# Patient Record
Sex: Female | Born: 1970 | Race: White | Hispanic: Yes | Marital: Married | Smoking: Never smoker
Health system: Southern US, Community
[De-identification: ages and names within clinical notes are randomized; demographics above are authoritative.]

## PROBLEM LIST (undated history)

## (undated) DIAGNOSIS — E119 Type 2 diabetes mellitus without complications: Secondary | ICD-10-CM

## (undated) DIAGNOSIS — I10 Essential (primary) hypertension: Secondary | ICD-10-CM

## (undated) DIAGNOSIS — D649 Anemia, unspecified: Secondary | ICD-10-CM

## (undated) HISTORY — PX: CHOLECYSTECTOMY: SHX55

## (undated) HISTORY — DX: Type 2 diabetes mellitus without complications: E11.9

## (undated) HISTORY — DX: Anemia, unspecified: D64.9

---

## 2006-03-21 ENCOUNTER — Inpatient Hospital Stay (HOSPITAL_COMMUNITY): Admission: AD | Admit: 2006-03-21 | Discharge: 2006-03-21 | Payer: Self-pay | Admitting: Obstetrics & Gynecology

## 2006-03-22 ENCOUNTER — Ambulatory Visit: Payer: Self-pay | Admitting: Family Medicine

## 2006-03-23 ENCOUNTER — Ambulatory Visit: Payer: Self-pay | Admitting: *Deleted

## 2006-06-08 ENCOUNTER — Ambulatory Visit: Payer: Self-pay | Admitting: Internal Medicine

## 2006-06-13 ENCOUNTER — Ambulatory Visit (HOSPITAL_COMMUNITY): Admission: RE | Admit: 2006-06-13 | Discharge: 2006-06-13 | Payer: Self-pay | Admitting: Internal Medicine

## 2006-09-25 ENCOUNTER — Ambulatory Visit: Payer: Self-pay | Admitting: Internal Medicine

## 2007-03-23 ENCOUNTER — Ambulatory Visit: Payer: Self-pay | Admitting: Internal Medicine

## 2007-04-13 ENCOUNTER — Ambulatory Visit: Payer: Self-pay | Admitting: Internal Medicine

## 2007-04-25 ENCOUNTER — Encounter: Payer: Self-pay | Admitting: Family Medicine

## 2007-04-25 ENCOUNTER — Ambulatory Visit: Payer: Self-pay | Admitting: Internal Medicine

## 2007-04-25 LAB — CONVERTED CEMR LAB
CO2: 25 meq/L (ref 19–32)
Calcium: 9.2 mg/dL (ref 8.4–10.5)
Chloride: 102 meq/L (ref 96–112)
Cholesterol: 192 mg/dL (ref 0–200)
Creatinine, Ser: 0.62 mg/dL (ref 0.40–1.20)
Eosinophils Relative: 1 % (ref 0–5)
Glucose, Bld: 79 mg/dL (ref 70–99)
HCT: 35.1 % — ABNORMAL LOW (ref 36.0–46.0)
Hemoglobin: 11.5 g/dL — ABNORMAL LOW (ref 12.0–15.0)
Lymphocytes Relative: 36 % (ref 12–46)
Lymphs Abs: 2.2 10*3/uL (ref 0.7–4.0)
Monocytes Absolute: 0.4 10*3/uL (ref 0.1–1.0)
Monocytes Relative: 7 % (ref 3–12)
RBC: 4.26 M/uL (ref 3.87–5.11)
Total Bilirubin: 1 mg/dL (ref 0.3–1.2)
Total CHOL/HDL Ratio: 4.7
Triglycerides: 159 mg/dL — ABNORMAL HIGH (ref <150)
VLDL: 32 mg/dL (ref 0–40)
WBC: 6 10*3/uL (ref 4.0–10.5)

## 2007-06-15 ENCOUNTER — Encounter: Payer: Self-pay | Admitting: Family Medicine

## 2007-06-15 ENCOUNTER — Ambulatory Visit: Payer: Self-pay | Admitting: Internal Medicine

## 2007-06-15 LAB — CONVERTED CEMR LAB: Chlamydia, DNA Probe: NEGATIVE

## 2007-06-21 ENCOUNTER — Encounter: Admission: RE | Admit: 2007-06-21 | Discharge: 2007-06-21 | Payer: Self-pay | Admitting: Family Medicine

## 2007-09-05 ENCOUNTER — Emergency Department (HOSPITAL_COMMUNITY): Admission: EM | Admit: 2007-09-05 | Discharge: 2007-09-05 | Payer: Self-pay | Admitting: Emergency Medicine

## 2007-09-27 ENCOUNTER — Ambulatory Visit: Payer: Self-pay | Admitting: Internal Medicine

## 2008-07-17 ENCOUNTER — Emergency Department (HOSPITAL_COMMUNITY): Admission: EM | Admit: 2008-07-17 | Discharge: 2008-07-17 | Payer: Self-pay | Admitting: Emergency Medicine

## 2008-08-22 ENCOUNTER — Ambulatory Visit: Payer: Self-pay | Admitting: Internal Medicine

## 2010-04-18 LAB — URINALYSIS, ROUTINE W REFLEX MICROSCOPIC
Bilirubin Urine: NEGATIVE
Ketones, ur: 15 mg/dL — AB
Nitrite: NEGATIVE
Protein, ur: NEGATIVE mg/dL
Urobilinogen, UA: 0.2 mg/dL (ref 0.0–1.0)

## 2010-04-18 LAB — COMPREHENSIVE METABOLIC PANEL
AST: 23 U/L (ref 0–37)
Albumin: 4.1 g/dL (ref 3.5–5.2)
Alkaline Phosphatase: 54 U/L (ref 39–117)
CO2: 24 mEq/L (ref 19–32)
Chloride: 99 mEq/L (ref 96–112)
Creatinine, Ser: 0.59 mg/dL (ref 0.4–1.2)
GFR calc Af Amer: >60 mL/min (ref >60)
GFR calc non Af Amer: >60 mL/min (ref >60)
Potassium: 3 mEq/L — ABNORMAL LOW (ref 3.5–5.1)
Total Bilirubin: 1.1 mg/dL (ref 0.3–1.2)

## 2010-04-18 LAB — URINE CULTURE: Colony Count: NO GROWTH

## 2010-04-18 LAB — DIFFERENTIAL
Basophils Absolute: 0 10*3/uL (ref 0.0–0.1)
Basophils Relative: 0 % (ref 0–1)
Eosinophils Absolute: 0 10*3/uL (ref 0.0–0.7)
Eosinophils Relative: 0 % (ref 0–5)
Lymphocytes Relative: 5 % — ABNORMAL LOW (ref 12–46)
Monocytes Absolute: 0.6 10*3/uL (ref 0.1–1.0)

## 2010-04-18 LAB — CBC
HCT: 33.6 % — ABNORMAL LOW (ref 36.0–46.0)
MCV: 79.5 fL (ref 78.0–100.0)
Platelets: 272 10*3/uL (ref 150–400)
RBC: 4.23 MIL/uL (ref 3.87–5.11)
WBC: 12.7 10*3/uL — ABNORMAL HIGH (ref 4.0–10.5)

## 2010-04-18 LAB — RAPID STREP SCREEN (MED CTR MEBANE ONLY): Streptococcus, Group A Screen (Direct): POSITIVE — AB

## 2010-04-18 LAB — PREGNANCY, URINE: Preg Test, Ur: NEGATIVE

## 2010-04-18 LAB — URINE MICROSCOPIC-ADD ON

## 2010-04-25 IMAGING — CR DG CHEST 2V
2 series · 2 of 2 positions shown · non-contrast
Comparison: None

CLINICAL DATA: Hypertension.  Headache.  Fever.

CHEST - 2 VIEW

[w chest pa]
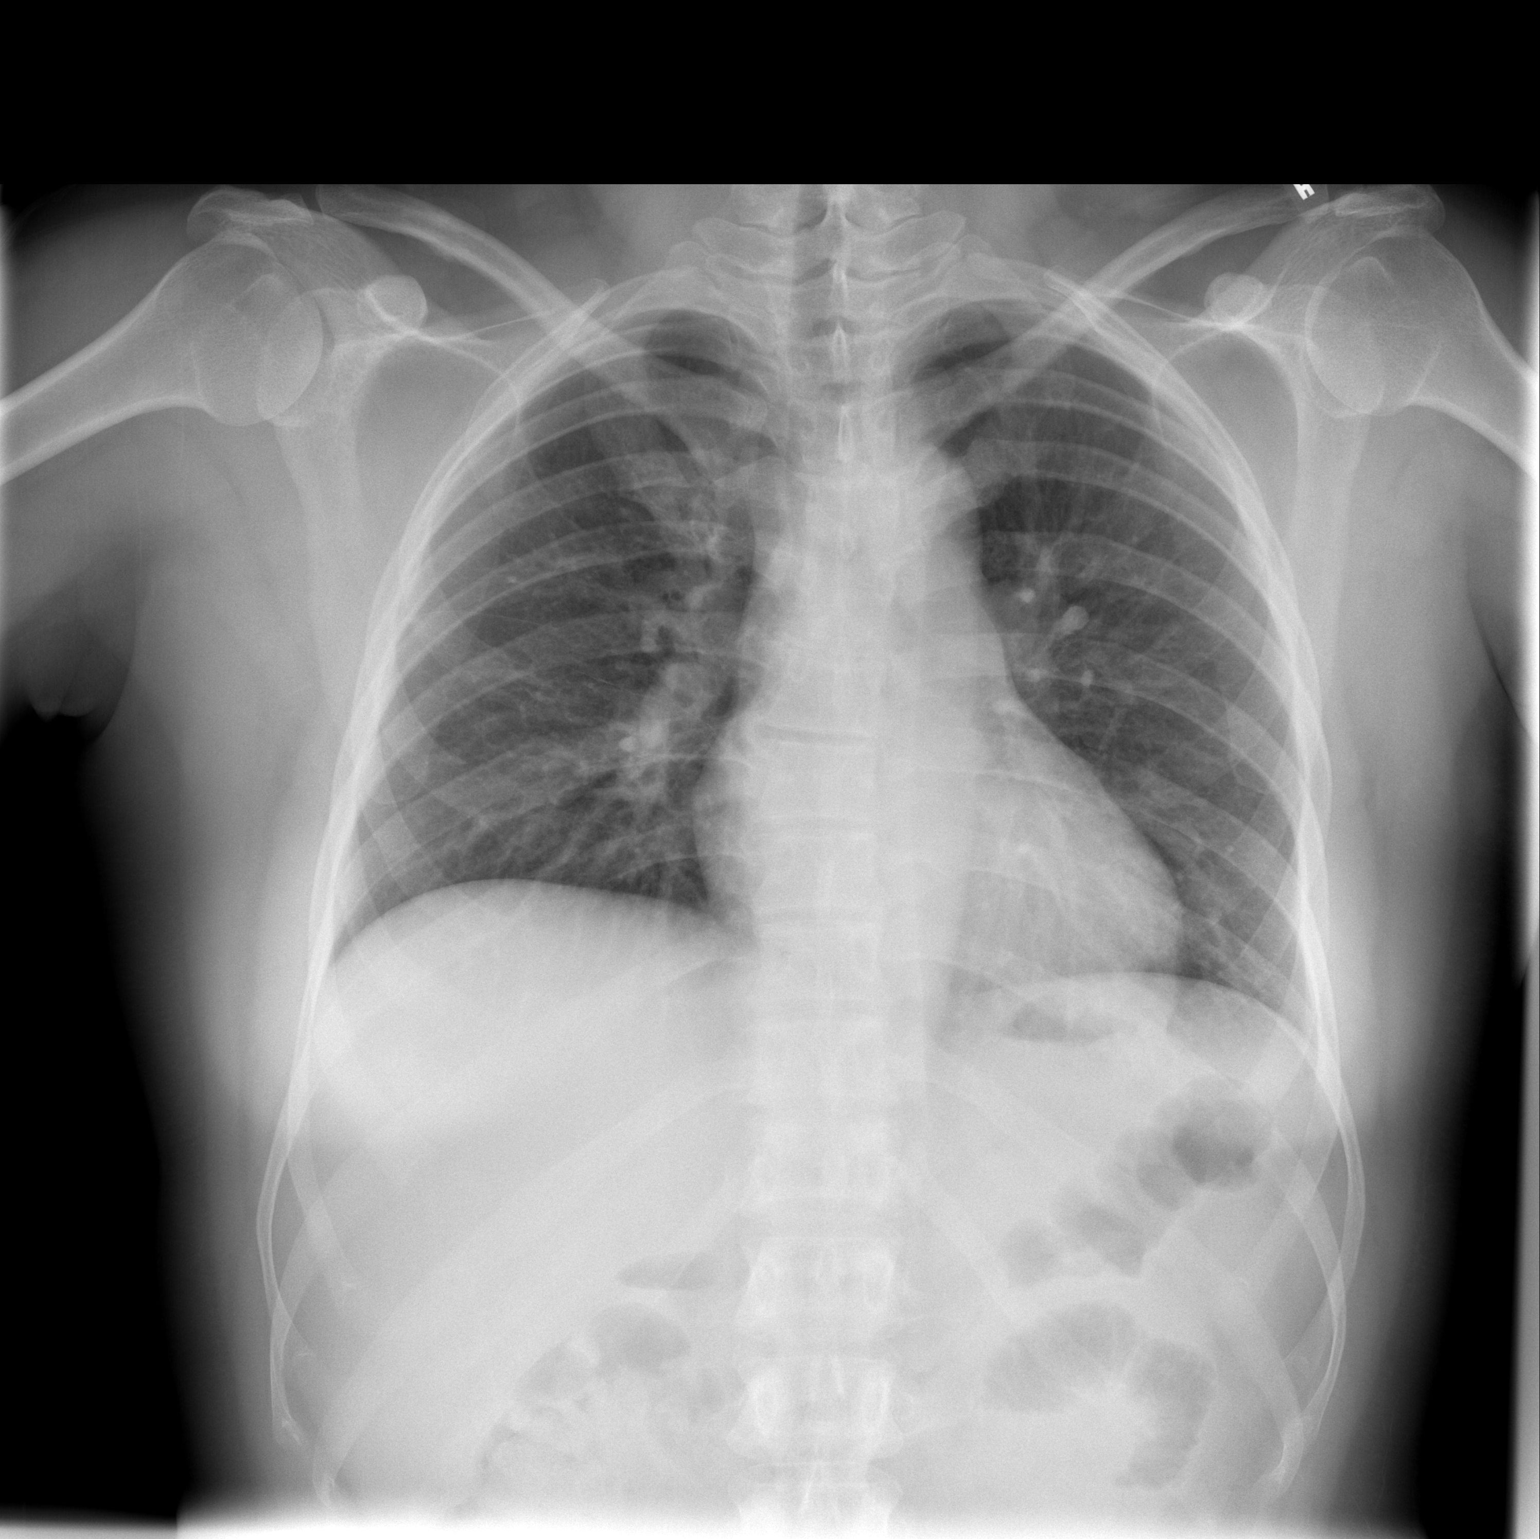

[w chest lat]
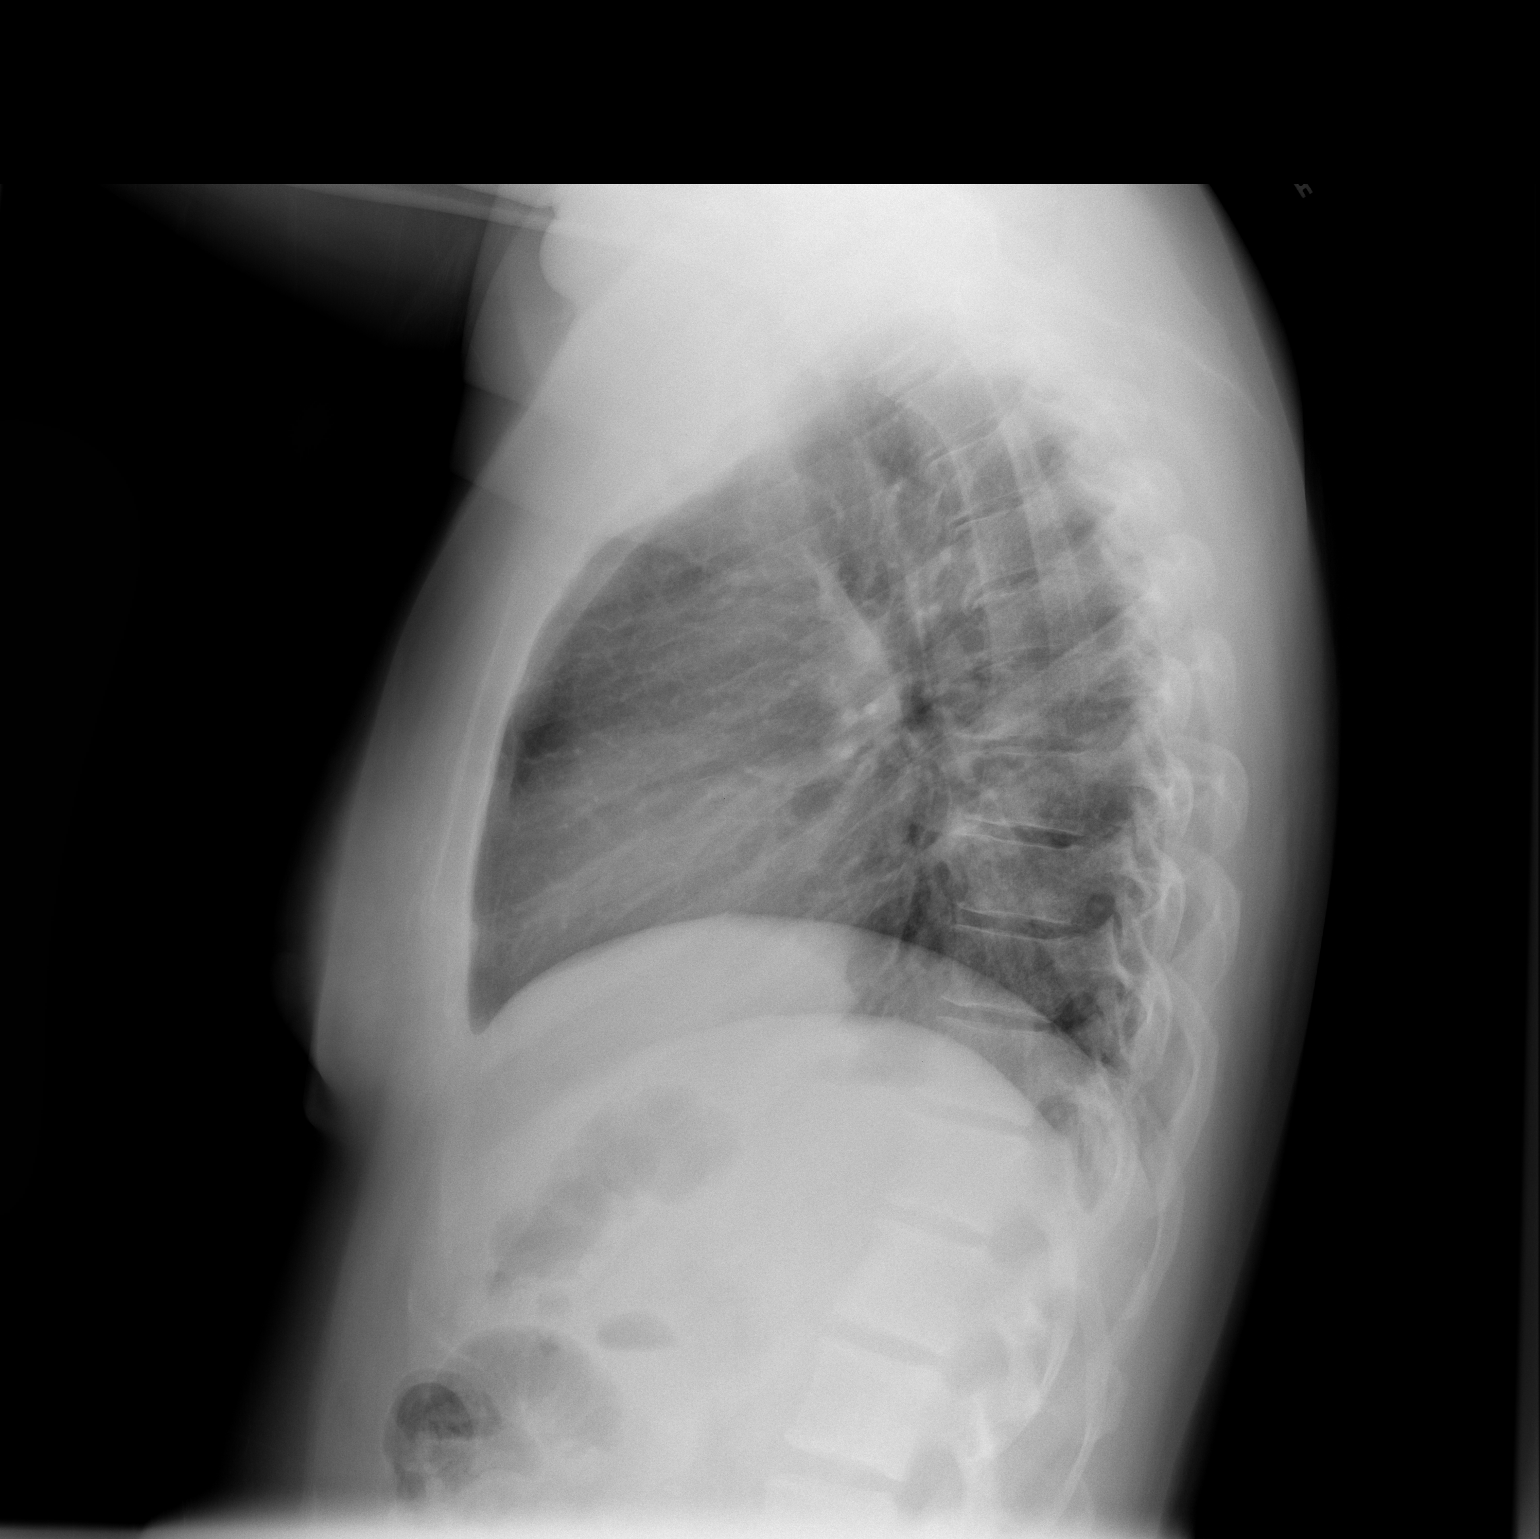

[2 of 2 positions shown; findings below may reference images not displayed]

FINDINGS: The heart size and mediastinal contours are within
normal limits.  Both lungs are clear.  Mild chronic peribronchial
thickening.  The visualized skeletal structures are unremarkable.
IMPRESSION: No active cardiopulmonary disease. Mild chronic bronchitic changes.

## 2012-04-18 ENCOUNTER — Other Ambulatory Visit (HOSPITAL_COMMUNITY): Payer: Self-pay | Admitting: General Surgery

## 2012-04-18 ENCOUNTER — Other Ambulatory Visit: Payer: Self-pay | Admitting: General Surgery

## 2012-04-18 ENCOUNTER — Other Ambulatory Visit (HOSPITAL_COMMUNITY)
Admission: RE | Admit: 2012-04-18 | Discharge: 2012-04-18 | Disposition: A | Discharge status: Home / Self Care | Payer: Self-pay | Source: Ambulatory Visit | Attending: General Surgery | Admitting: General Surgery

## 2012-04-18 DIAGNOSIS — N83209 Unspecified ovarian cyst, unspecified side: Secondary | ICD-10-CM | POA: Insufficient documentation

## 2012-04-18 DIAGNOSIS — R1032 Left lower quadrant pain: Secondary | ICD-10-CM | POA: Insufficient documentation

## 2012-04-18 DIAGNOSIS — Z139 Encounter for screening, unspecified: Secondary | ICD-10-CM

## 2012-04-24 ENCOUNTER — Ambulatory Visit (HOSPITAL_COMMUNITY)
Admission: RE | Admit: 2012-04-24 | Discharge: 2012-04-24 | Disposition: A | Discharge status: Home / Self Care | Payer: Self-pay | Source: Ambulatory Visit | Attending: General Surgery | Admitting: General Surgery

## 2012-04-24 ENCOUNTER — Other Ambulatory Visit (HOSPITAL_COMMUNITY): Payer: Self-pay | Admitting: General Surgery

## 2012-04-24 DIAGNOSIS — Z139 Encounter for screening, unspecified: Secondary | ICD-10-CM

## 2012-04-24 DIAGNOSIS — R109 Unspecified abdominal pain: Secondary | ICD-10-CM | POA: Insufficient documentation

## 2012-04-24 DIAGNOSIS — N8300 Follicular cyst of ovary, unspecified side: Secondary | ICD-10-CM | POA: Insufficient documentation

## 2012-04-24 DIAGNOSIS — Z1231 Encounter for screening mammogram for malignant neoplasm of breast: Secondary | ICD-10-CM | POA: Insufficient documentation

## 2012-04-24 DIAGNOSIS — N83209 Unspecified ovarian cyst, unspecified side: Secondary | ICD-10-CM | POA: Insufficient documentation

## 2012-05-02 ENCOUNTER — Ambulatory Visit (INDEPENDENT_AMBULATORY_CARE_PROVIDER_SITE_OTHER): Payer: Self-pay | Admitting: Obstetrics and Gynecology

## 2012-05-02 ENCOUNTER — Encounter: Payer: Self-pay | Admitting: Obstetrics and Gynecology

## 2012-05-02 VITALS — BP 160/100 | Wt 179.4 lb

## 2012-05-02 DIAGNOSIS — N920 Excessive and frequent menstruation with regular cycle: Secondary | ICD-10-CM

## 2012-05-02 DIAGNOSIS — N921 Excessive and frequent menstruation with irregular cycle: Secondary | ICD-10-CM

## 2012-05-02 MED ORDER — NORETHINDRONE 0.35 MG PO TABS: 1.0000 | ORAL_TABLET | Freq: Every day | ORAL | Status: DC

## 2012-05-02 MED ORDER — TRAMADOL HCL 50 MG PO TABS: 50.0000 mg | ORAL_TABLET | ORAL | Status: DC

## 2012-05-02 NOTE — Patient Instructions (Signed)
Begin oral contraceptives minipill to take 1 tablet daily beginning today. You may try tramadol for pain management or use ibuprofen during menses

## 2012-05-02 NOTE — Progress Notes (Signed)
  Assessment:  Menorrhagia with anemia #2: Stable small left ovarian cyst felt clinically significant   Plan:  1 begin progesterone only contraceptive pills for menstrual regulation Micronor 1 by mouth daily 2: Followup visit in 6 weeks for review of systems  Subjective:  Angela Ritter is a 42 y.o. female, No obstetric history on file., who presents for referral from Dr. Hebert Soho who saw her in ultrasound had shown a left ovarian cyst 4.0 cm, simple cyst, with the uterus that's upper limits normal size without discrete fibroids. She is anemic and periods are  lasting 7 days , with clots for 3-4 of the 7 days  The following portions of the patient's history were reviewed and updated as appropriate: allergies, current medications, past medical & surgical history, & past family history.   There is no significant family history of breast or ovarian cancer.    Review of Systems Pertinent items are noted in HPI. Breast:Negative for breast lump,nipple discharge or nipple retraction Gastrointestinal: Negative for abdominal pain, change in bowel habits or rectal bleeding GU: Negative for dysuria, frequency, urgency or incontinence.   GYN: Patient's last menstrual period was 05/01/2012. the pain is described as worse with her periods is across the lower abdomen over to the left side and down the left thigh  Objective:  BP 160/100  Wt 179 lb 6.4 oz (81.375 kg)  LMP 05/01/2012    BMI: There is no height on file to calculate BMI.  General Appearance: Alert, appropriate appearance for age. No acute distress HEENT: Grossly normal Neck / Thyroid: Supple, no masses, nodes or enlargement Cardiovascular: Regular rate and rhythm. S1, S2, no murmur Lungs: Clear to auscultation bilaterally Back: No CVA tenderness Gastrointestinal: Soft, non-tender, no masses or organomegaly Pelvic Exam: External genitalia: normal general appearance Vaginal: normal mucosa without prolapse or lesions Cervix:  Multiparous Adnexa: normal bimanual exam Uterus: mid-position and Slight tenderness not felt to be infectious but appropriate for patient being examined her menses Rectovaginal: not indicated  Christin Bach MD

## 2012-05-04 ENCOUNTER — Other Ambulatory Visit: Payer: Self-pay | Admitting: General Surgery

## 2012-05-04 DIAGNOSIS — R928 Other abnormal and inconclusive findings on diagnostic imaging of breast: Secondary | ICD-10-CM

## 2012-05-23 ENCOUNTER — Other Ambulatory Visit: Payer: Self-pay | Admitting: General Surgery

## 2012-05-23 ENCOUNTER — Ambulatory Visit (HOSPITAL_COMMUNITY)
Admission: RE | Admit: 2012-05-23 | Discharge: 2012-05-23 | Disposition: A | Discharge status: Home / Self Care | Payer: Self-pay | Source: Ambulatory Visit | Attending: General Surgery | Admitting: General Surgery

## 2012-05-23 ENCOUNTER — Other Ambulatory Visit (HOSPITAL_COMMUNITY): Payer: Self-pay | Admitting: General Surgery

## 2012-05-23 DIAGNOSIS — R928 Other abnormal and inconclusive findings on diagnostic imaging of breast: Secondary | ICD-10-CM

## 2012-05-23 DIAGNOSIS — N63 Unspecified lump in unspecified breast: Secondary | ICD-10-CM | POA: Insufficient documentation

## 2012-05-28 ENCOUNTER — Other Ambulatory Visit: Payer: Self-pay

## 2012-06-06 ENCOUNTER — Ambulatory Visit (HOSPITAL_COMMUNITY)
Admission: RE | Admit: 2012-06-06 | Discharge: 2012-06-06 | Disposition: A | Discharge status: Home / Self Care | Payer: Self-pay | Source: Ambulatory Visit | Attending: General Surgery | Admitting: General Surgery

## 2012-06-06 ENCOUNTER — Encounter (HOSPITAL_COMMUNITY): Payer: Self-pay

## 2012-06-06 ENCOUNTER — Other Ambulatory Visit (HOSPITAL_COMMUNITY): Payer: Self-pay | Admitting: General Surgery

## 2012-06-06 DIAGNOSIS — N63 Unspecified lump in unspecified breast: Secondary | ICD-10-CM | POA: Insufficient documentation

## 2012-06-06 DIAGNOSIS — D249 Benign neoplasm of unspecified breast: Secondary | ICD-10-CM | POA: Insufficient documentation

## 2012-06-06 MED ORDER — LIDOCAINE HCL (PF) 2 % IJ SOLN
INTRAMUSCULAR | Status: AC
  Administered 2012-06-06: 10 mL via INTRADERMAL
  Filled 2012-06-06: qty 10

## 2012-06-06 NOTE — Progress Notes (Signed)
Lidocaine 2%          10mL injected                         Right breast biopsy performed

## 2012-06-27 ENCOUNTER — Ambulatory Visit: Payer: Self-pay | Admitting: Obstetrics and Gynecology

## 2014-03-01 IMAGING — US US BREAST*R*
1 series · 4 of 4 positions shown · non-contrast
Comparison: With priors

CLINICAL DATA: Abnormal right screening mammogram

DIGITAL DIAGNOSTIC RIGHT MAMMOGRAM WITH CAD AND RIGHT BREAST
ULTRASOUND:

[Series 1: us breast*right* · 0.08mm/px · 4 of 4 slices shown]
[im 1/4]
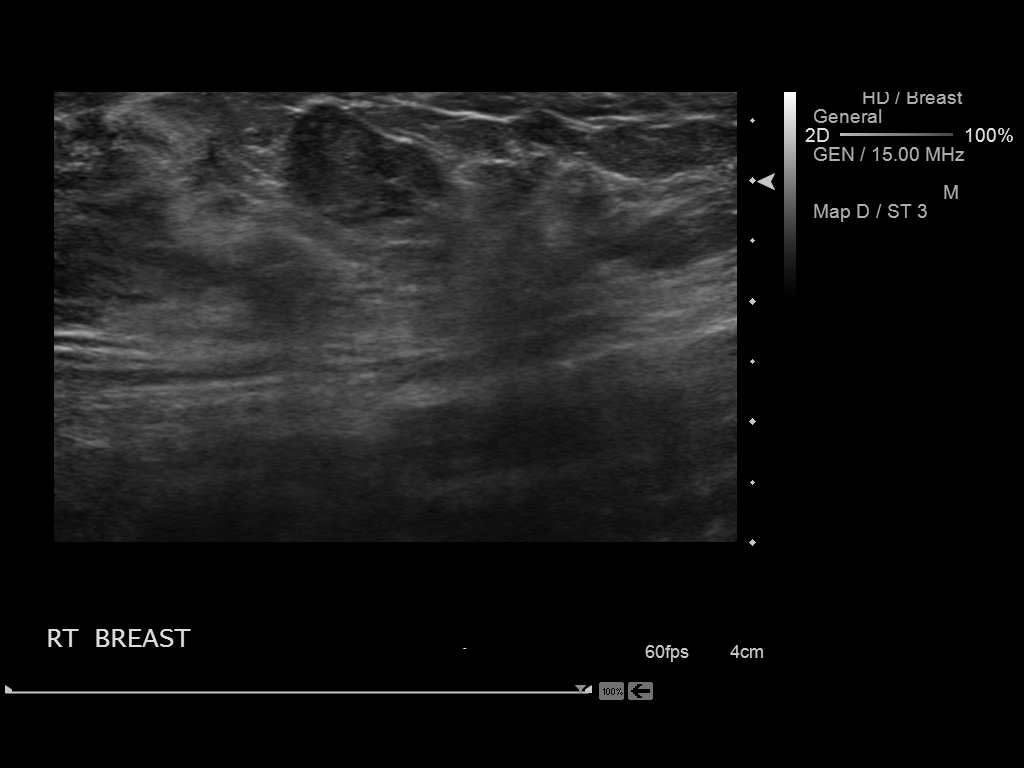
[im 2/4]
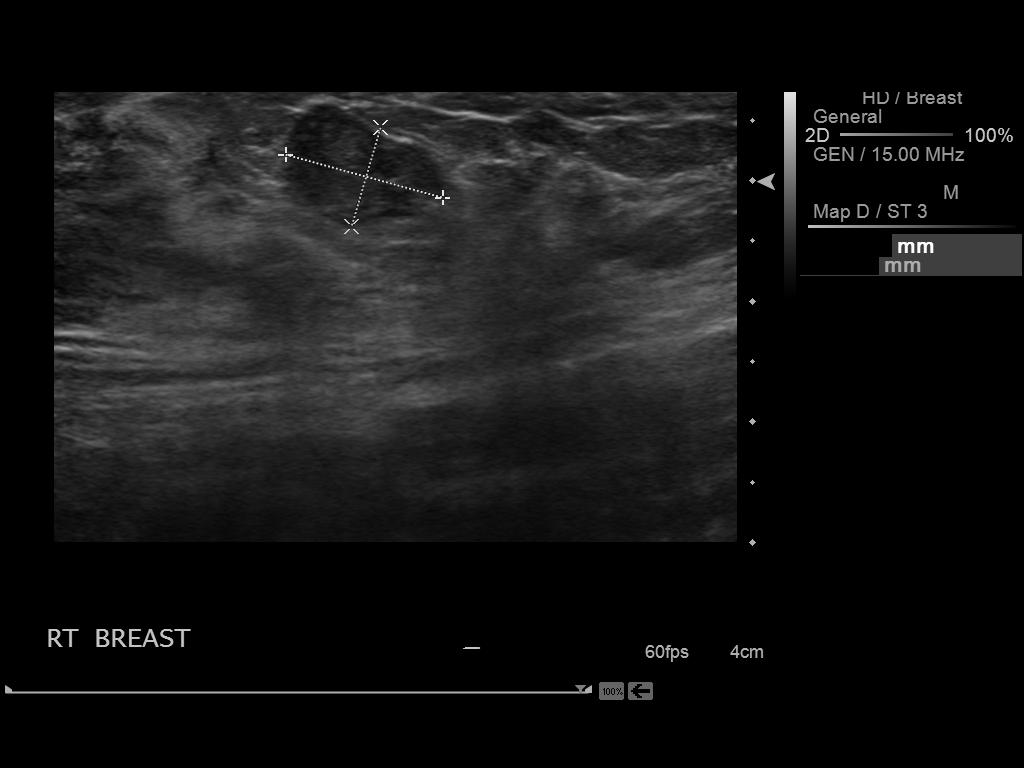
[im 3/4]
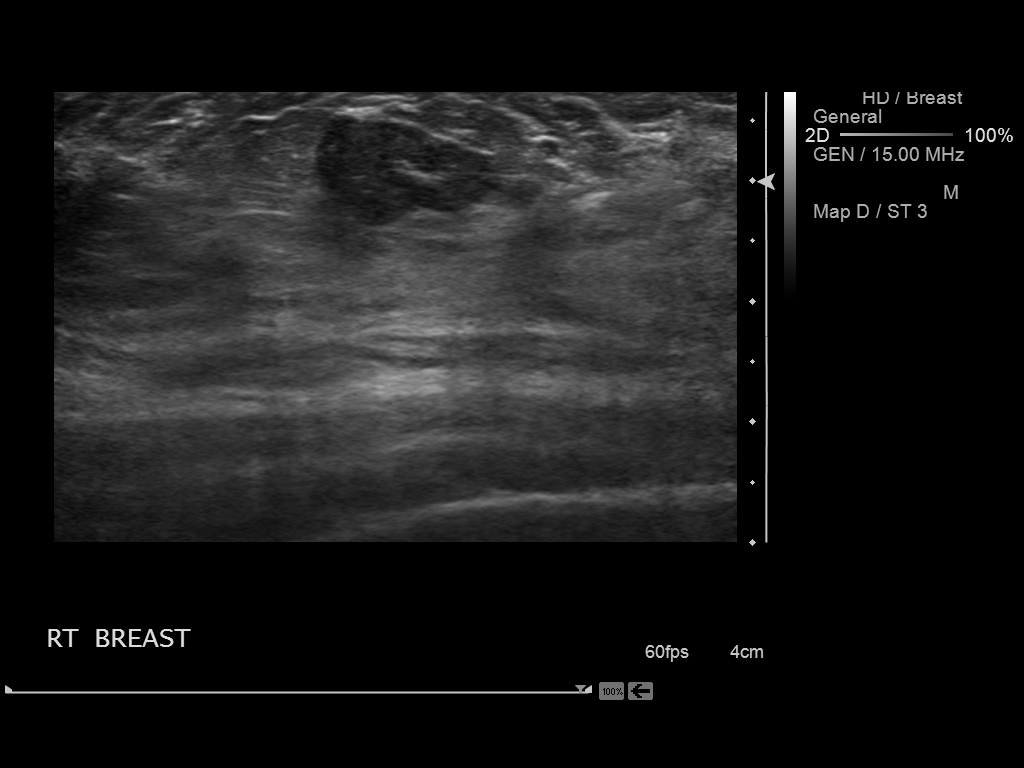
[im 4/4]
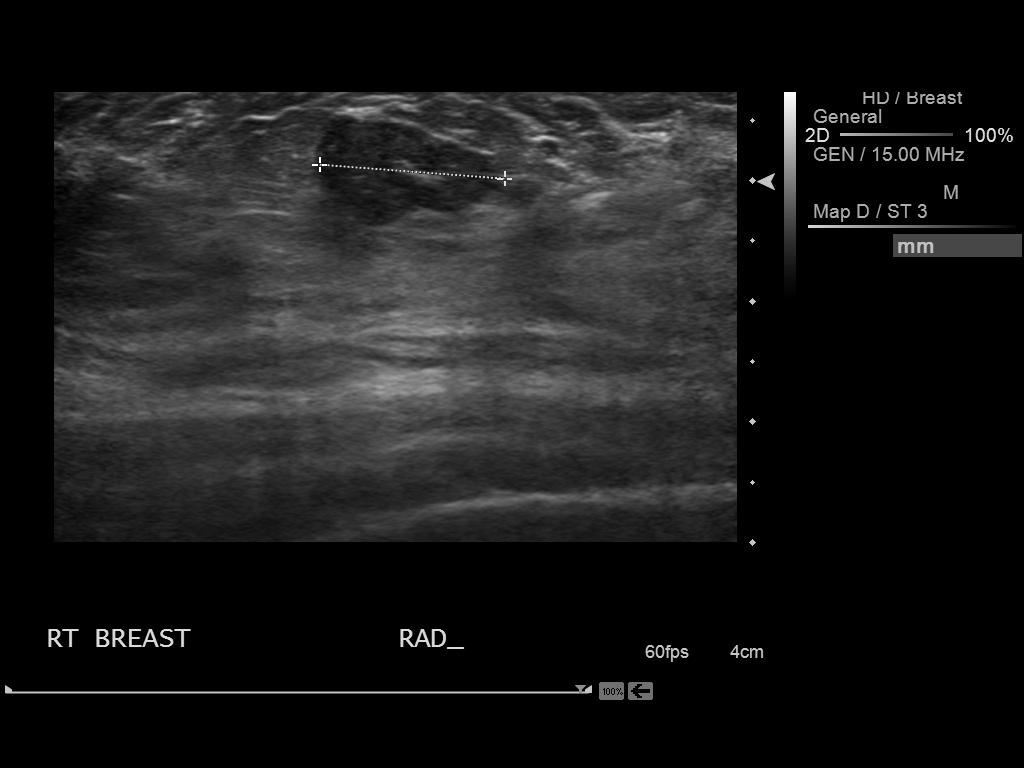

[4 of 4 positions shown; findings below may reference images not displayed]

FINDINGS: ACR Breast Density Category 3: The breast tissue is heterogeneously
dense.

Spot compression and true lateral views of the right breast were
performed.  There is an asymmetric density in the posterior third
of the upper aspect of the breast.  There are no malignant-type
microcalcifications.

Mammographic images were processed with CAD.

On physical exam, I do not palpate a discrete mass in the right
breast.

Ultrasound is performed, showing there is a hypoechoic mass with
increased through transmission in the right breast at 10 o'clock 7
cm from the nipple measuring 1.4 x 0.9 x 1.5 cm.  Sonographic
evaluation of the right axilla fails to reveal enlarged adenopathy.
IMPRESSION: Suspicious right breast mass.

RECOMMENDATION:
Ultrasound guided core biopsy of the right breast is recommended.
The patient is being referred to [facility].

I have discussed the findings and recommendations with the patient.
Results were also provided in writing at the conclusion of the
visit.  If applicable, a reminder letter will be sent to the
patient regarding the next appointment.

BI-RADS CATEGORY 4:  Suspicious abnormality - biopsy should be
considered.

## 2014-03-15 IMAGING — US US RT BREAST BX W LOC DEV 1ST LESION IMG BX SPEC US GUIDE
1 series · 8 of 8 positions shown · non-contrast
Comparison: none

***ADDENDUM*** CREATED: 06/07/2012 [DATE]

Final pathology demonstrates a FIBROADENOMA.
Histology correlates with imaging findings.
The patient'Wasi Jumper, Blain, was contacted by phone on 06/07/2012
and these results given to her which she understood. Her questions
were answered.
Per her daughter, the patient had no complaints with her biopsy
site, .
Recommend annual bilateral screening mammograms.
***END ADDENDUM*** SIGNED BY: Utimlal Rangrej, M.D.
CLINICAL DATA: 1.5 cm oval nodule at 10 o'clock, 7 cm from the
right nipple

[Series 1: us right breast bx w loc dev 1st lesion img bx spe · 0.08mm/px · 8 of 8 slices shown]
[im 1/8]
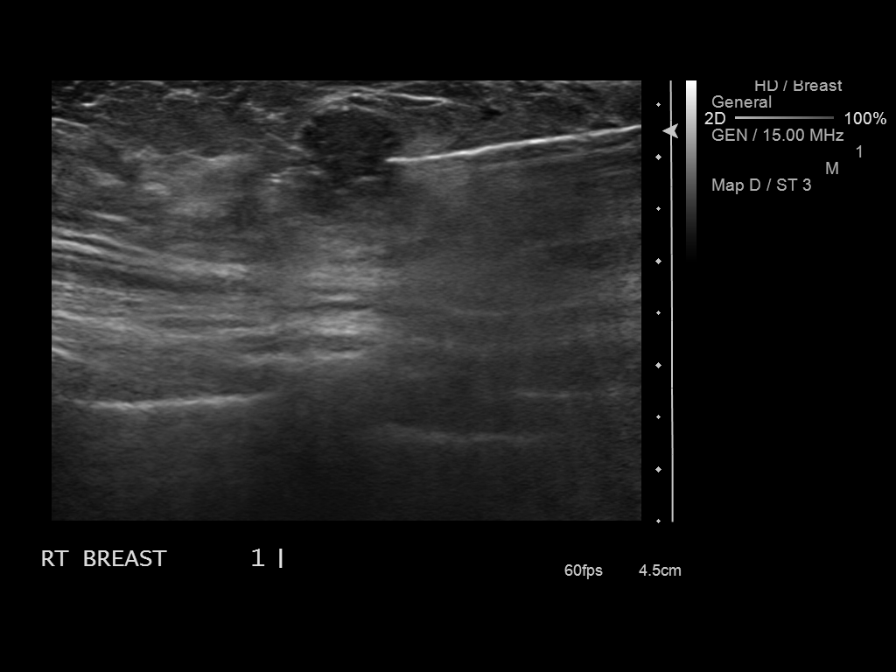
[im 2/8]
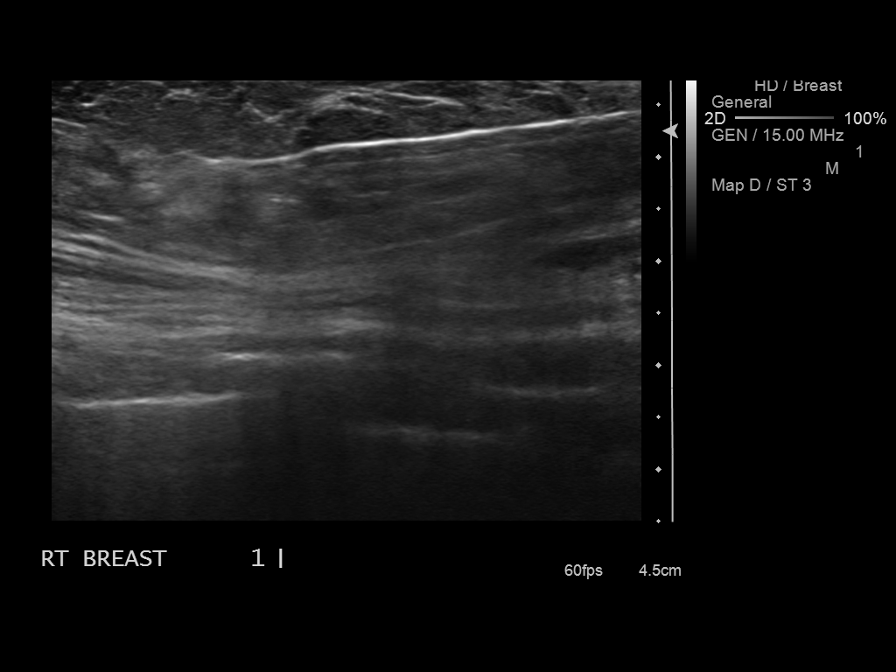
[im 3/8]
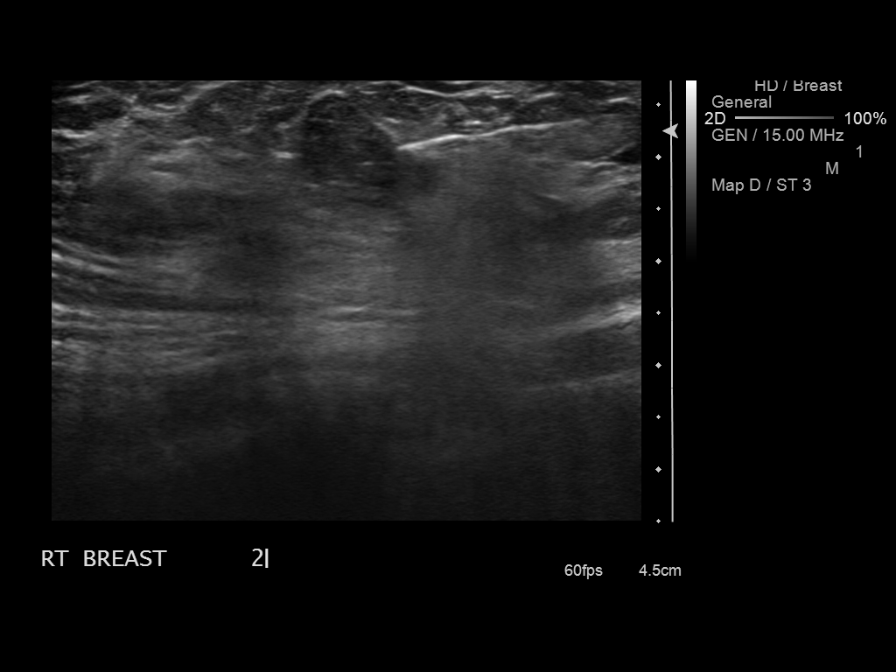
[im 4/8]
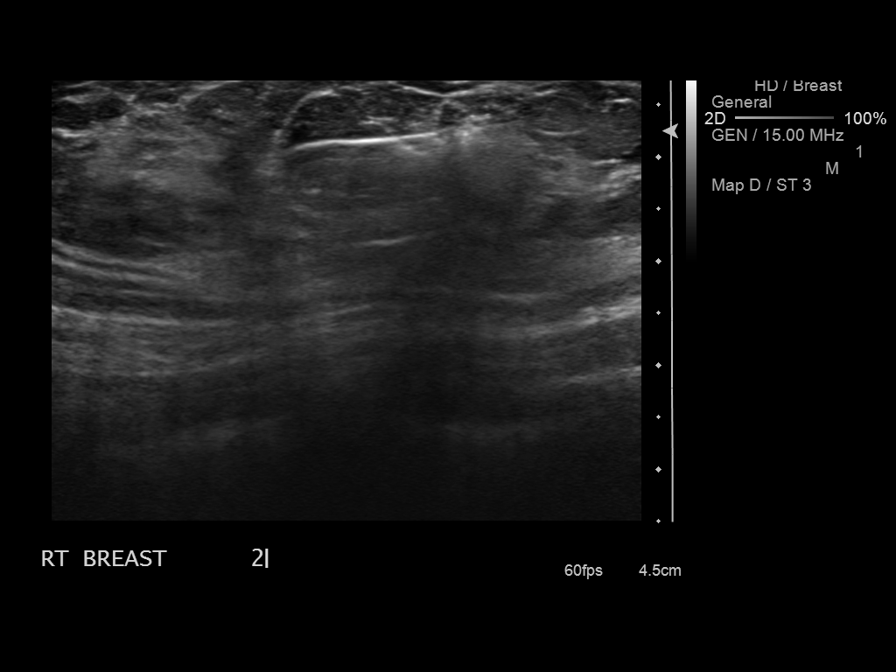
[im 5/8]
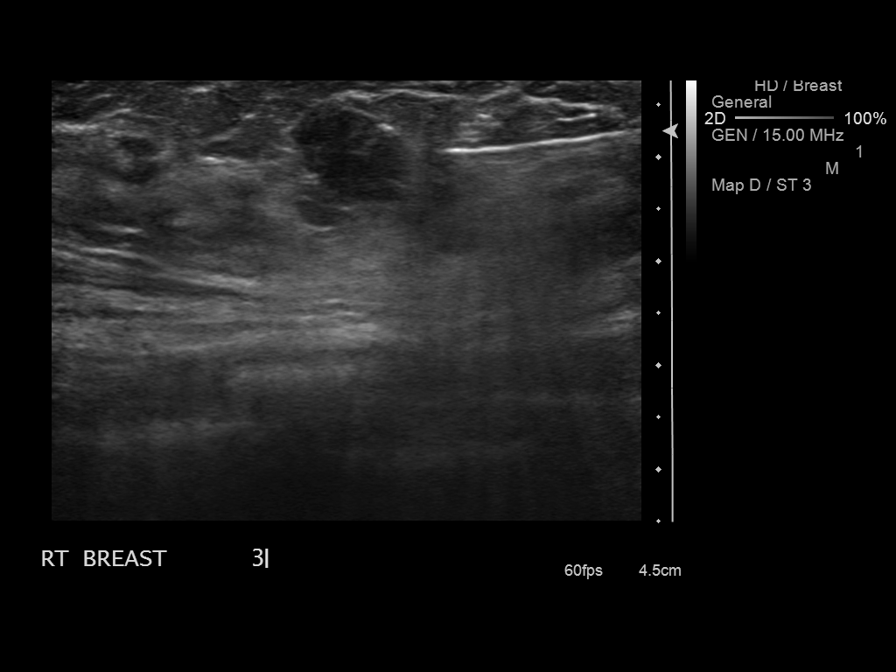
[im 6/8]
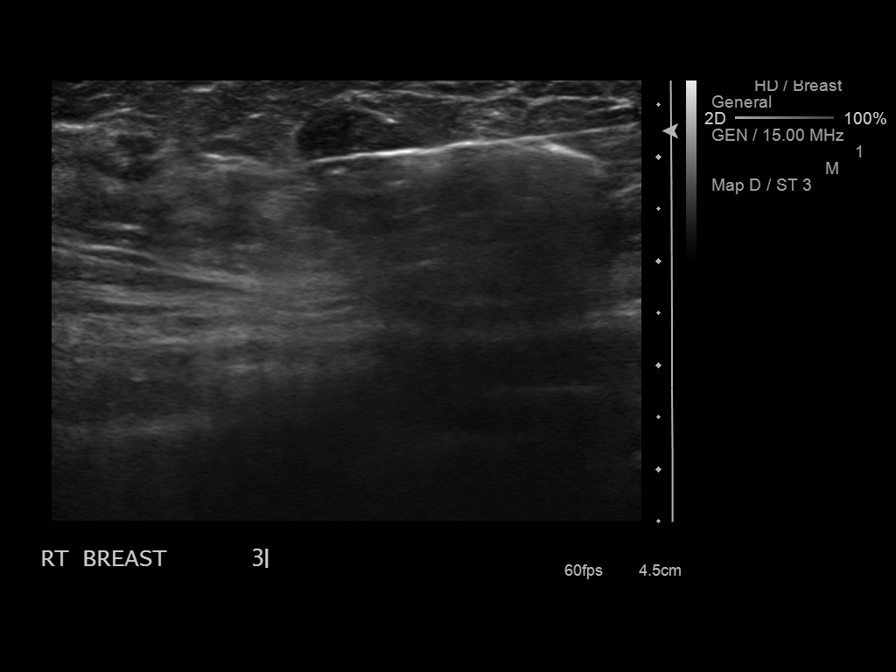
[im 7/8]
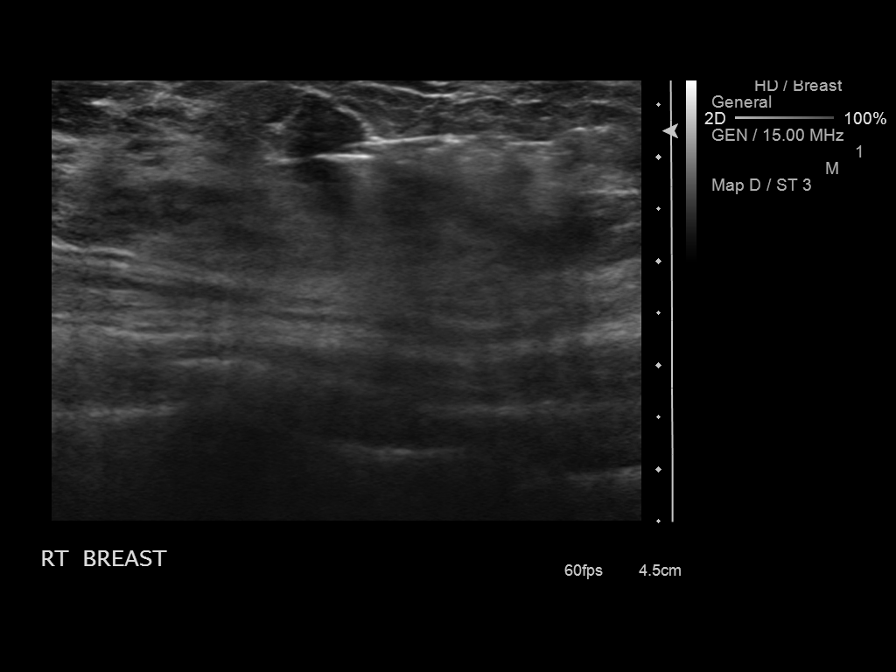
[im 8/8]
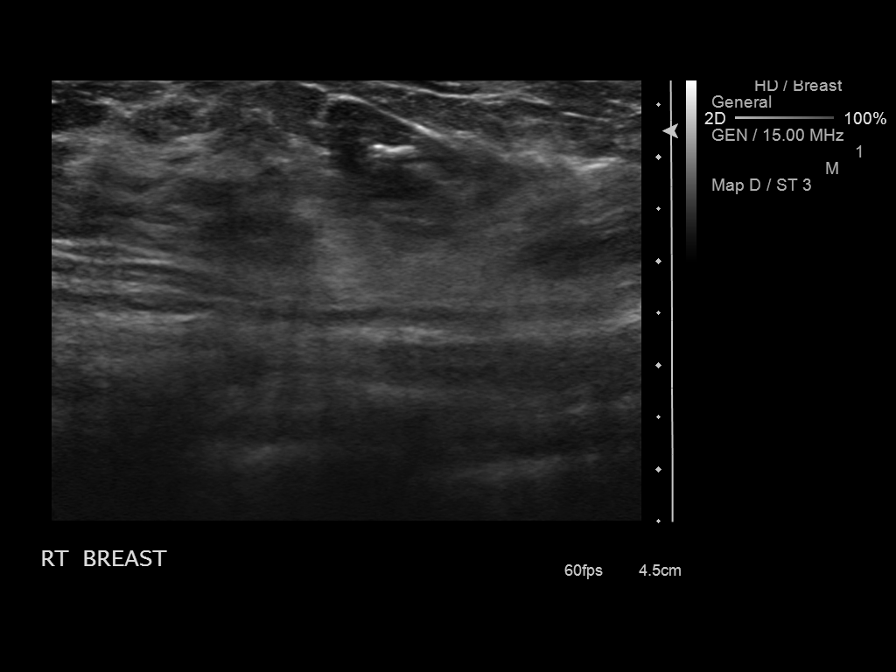

[8 of 8 positions shown; findings below may reference images not displayed]

ULTRASOUND GUIDED CORE BIOPSY OF THE RIGHT BREAST

The patient and I discussed the procedure of ultrasound-guided
biopsy, including benefits and alternatives.  We discussed the high
likelihood of a successful procedure. We discussed the risks of the
procedure, including infection, bleeding, tissue injury, clip
migration, and inadequate sampling.  Written informed consent was
given.

Using sterile technique, 2% lidocaine, ultrasound guidance, and a
14 gauge automated biopsy device, biopsy was performed of the
nodule at 10 o'clock, 7 cm from the right nipple, using a
caudocranial approach.  At the conclusion of the procedure, a
ribbon tissue marker clip was deployed into the biopsy cavity.
Follow up 2 view mammogram was performed and dictated separately.
IMPRESSION: Ultrasound guided biopsy of a nodule at 10 o'clock 7 cm from the
right nipple.  No apparent complications.

## 2014-10-03 ENCOUNTER — Ambulatory Visit: Payer: Self-pay

## 2014-10-14 ENCOUNTER — Other Ambulatory Visit: Payer: Self-pay | Admitting: Obstetrics and Gynecology

## 2014-10-14 DIAGNOSIS — Z1231 Encounter for screening mammogram for malignant neoplasm of breast: Secondary | ICD-10-CM

## 2014-10-30 ENCOUNTER — Ambulatory Visit (HOSPITAL_COMMUNITY)
Admission: RE | Admit: 2014-10-30 | Discharge: 2014-10-30 | Disposition: A | Discharge status: Home / Self Care | Payer: Self-pay | Source: Ambulatory Visit | Attending: Obstetrics and Gynecology | Admitting: Obstetrics and Gynecology

## 2014-10-30 ENCOUNTER — Encounter (HOSPITAL_COMMUNITY): Payer: Self-pay

## 2014-10-30 ENCOUNTER — Ambulatory Visit
Admission: RE | Admit: 2014-10-30 | Discharge: 2014-10-30 | Disposition: A | Discharge status: Home / Self Care | Payer: No Typology Code available for payment source | Source: Ambulatory Visit | Attending: Obstetrics and Gynecology | Admitting: Obstetrics and Gynecology

## 2014-10-30 VITALS — BP 116/64 | Temp 98.0°F | Ht 64.5 in | Wt 162.0 lb

## 2014-10-30 DIAGNOSIS — Z1231 Encounter for screening mammogram for malignant neoplasm of breast: Secondary | ICD-10-CM

## 2014-10-30 DIAGNOSIS — Z1239 Encounter for other screening for malignant neoplasm of breast: Secondary | ICD-10-CM

## 2014-10-30 HISTORY — DX: Essential (primary) hypertension: I10

## 2014-10-30 NOTE — Patient Instructions (Addendum)
Education materials on self breast awareness given. Explained to [facility] that she did not need a Pap smear today due to last Pap smear was 04/18/2012. Let her know [facility] will cover Pap smears every 3 years unless has a history of abnormal Pap smears. Reminded patient that her next Pap smear is due next April and to call Sabrina to schedule appointment with [facility] . Referred patient to the [facility] for screening mammogram. Appointment scheduled for Thursday, October 30, 2014 at 1110. Patient aware of appointment and will be there. Let patient know the [facility] will follow up with her within the next couple weeks with results by letter or phone. Angela Ritter verbalized understanding.  Phoenyx Melka, Arvil Chaco, RN 11:55 AM

## 2014-10-30 NOTE — Addendum Note (Signed)
Encounter addended by: Loletta Parish, RN on: 10/30/2014  1:16 PM<BR>     Documentation filed: Patient Instructions Section

## 2014-10-30 NOTE — Progress Notes (Addendum)
No complaints today.  Pap Smear:  Pap smear not completed today. Last Pap smear was 04/18/2012 at Dr. Scherry Ran office and normal. Per patient has no history of an abnormal Pap smear. Last Pap smear result is in EPIC.  Physical exam: Breasts Breasts symmetrical. No skin abnormalities bilateral breasts. No nipple retraction bilateral breasts. No nipple discharge bilateral breasts. No lymphadenopathy. No lumps palpated bilateral breasts. No complaints of pain or tenderness on exam. Referred patient to the [facility] for screening mammogram. Appointment scheduled for Thursday, October 30, 2014 at 1110.     Pelvic/Bimanual No Pap smear completed today since last Pap smear was 04/18/2012. Pap smear not indicated per [facility] guidelines.   Used interpreter Benjamine Sprague.

## 2014-10-30 NOTE — Addendum Note (Signed)
Encounter addended by: Loletta Parish, RN on: 10/30/2014 12:21 PM<BR>     Documentation filed: Notes Section

## 2015-04-23 ENCOUNTER — Encounter (HOSPITAL_COMMUNITY): Payer: Self-pay

## 2015-04-23 ENCOUNTER — Ambulatory Visit (HOSPITAL_COMMUNITY)
Admission: RE | Admit: 2015-04-23 | Discharge: 2015-04-23 | Disposition: A | Discharge status: Home / Self Care | Payer: Self-pay | Source: Ambulatory Visit | Attending: Obstetrics and Gynecology | Admitting: Obstetrics and Gynecology

## 2015-04-23 VITALS — BP 114/68 | Temp 98.1°F | Ht 64.5 in | Wt 172.6 lb

## 2015-04-23 DIAGNOSIS — Z01419 Encounter for gynecological examination (general) (routine) without abnormal findings: Secondary | ICD-10-CM

## 2015-04-23 NOTE — Patient Instructions (Signed)
Explained to [facility] that [facility] will cover Pap smears and HPV typing every 5 years unless has a history of abnormal Pap smears. Reminded patient that she is due for her screening mammogram in October 2017 and that she will need to renew her [facility] card prior to her mammogram if still eligible for [facility]. Let patient know will follow up with her within the next couple weeks with results of Pap smear by phone. Angela Ritter verbalized understanding.  Lynsee Wands, Arvil Chaco, RN 12:18 PM

## 2015-04-23 NOTE — Progress Notes (Addendum)
No complaints today.   Pap Smear: Pap smear not completed today. Last Pap smear was 04/18/2012 at Dr. Scherry Ran office and normal. Per patient has no history of an abnormal Pap smear. Last Pap smear result is in EPIC.  Pelvic/Bimanual   Ext Genitalia No lesions, no swelling and no discharge observed on external genitalia.         Vagina Vagina pink and normal texture. No lesions or discharge observed in vagina.          Cervix Cervix is present. Cervix pink and red around os. Cervix is of normal texture. No discharge observed.     Uterus Uterus is present and palpable. Uterus in normal position and normal size.        Adnexae Bilateral ovaries present and palpable. No tenderness on palpation.          Rectovaginal No rectal exam completed today since patient had no rectal complaints. No skin abnormalities observed on exam.    Smoking History: Patient has never smoked.  Patient Navigation: Patient education provided. Access to services provided for patient through [facility] program. Spanish interpreter provided.   Used Spanish interpreter Sherolyn Buba from [facility]

## 2015-04-28 LAB — CYTOLOGY - PAP

## 2015-05-11 ENCOUNTER — Telehealth (HOSPITAL_COMMUNITY): Payer: Self-pay | Admitting: *Deleted

## 2015-05-11 NOTE — Telephone Encounter (Signed)
Telephoned patient at home # and discussed normal pap smear results. HPV was negative. Next pap smear due in 5 years. Patient voiced understanding.

## 2015-06-05 ENCOUNTER — Other Ambulatory Visit: Payer: No Typology Code available for payment source

## 2015-06-05 ENCOUNTER — Ambulatory Visit: Payer: Self-pay

## 2015-06-05 VITALS — BP 150/96 | HR 62 | Temp 98.3°F | Resp 18 | Ht 64.0 in | Wt 176.7 lb

## 2015-06-05 DIAGNOSIS — Z0000 Encounter for general adult medical examination without abnormal findings: Secondary | ICD-10-CM

## 2015-06-05 NOTE — Progress Notes (Signed)
Patient is a new patient to the [facility] program and presents with interpreter Anastasio Auerbach and is currently a [facility] patient effective 04/23/2015.   Clinical Measurements: Patient is 5 ft. 4 inches, weight 176.7 lbs, and BMI 30.4   Medical History: Patient has history of high cholesterol which was checked in February and back down to normal per patient. Patient does have a history of  Pre diabetes. Patient has a history of hypertension and has been without medication for at least a month. Per patient her down left town and is no longer in town. Per patient no diagnosed history of coronary heart disease, heart attack, heart failure, stroke/TIA, vascular disease or congenital heart defects. Patient has a pain from back of neck that goes across her head.  Blood Pressure, Self-measurement: Patient states she usually checks her Blood pressure at [facility] a couple time a week and had been sharing results with her doctor.   Nutrition Assessment: Patient stated that eats 2-3  fruits every day. Patient states she eats 2- 3 servings of vegetables a day. Per patient does eat 3 or more ounces of whole grains daily. Patient does eat two or more servings of fish weekly.  Patient states she does not drink more than 36 ounces or 450 calories of beverages with added sugars weekly. Patient states she drinks a lot of water.   Physical Activity Assessment: Patient stated she has been walking 30 minutes a day five days a week and no vigorous exercise.  Smoking Status: Patient stated that has never smoked and is not exposed to smoke.  Quality of Life Assessment: In assessing patient's physical quality of life she stated that out of the past 30 days that she has felt her health was good all days. Patient also stated that in the past 30 days that her mental health was not good including stress, depression and problems with emotions for all days.Patient did state that out of the past 30 days she felt her physical or mental  health had  kept her from doing her usual activities including self-care, work or recreation for all days.   Plan: Lab work will be done today including a lipid panel and Hgb A1C. Will call lab results when they are finished. Patient will receive second Health Coaching for risk reduction, establishing goals and then as patient wants.

## 2015-06-05 NOTE — Patient Instructions (Signed)
Discussed health assessment with patient.She will be called with results of lab work and we will then discussed any further follow up the patient needs. Patient verbalized understanding. 

## 2015-06-05 NOTE — Progress Notes (Signed)
FIRST HEALTH COACH: During initial screening  Anastasio Auerbach interpreter was present, wellness health coaching was started.  Blood Pressure: Blood pressure readings were 158/92 and 150/96. Readings were in the hypertensive area. Patient has not had medication in a month. Informed that would get patient a doctor's appointment as soon as could. Will talk with patient's daughter about when was a good time since she provides transportation. Discussed non changeable and changeable risk factors with patient. Discussed changeable risk factors specific to patient. These included overweight and stress. Discussed Heart Wise Program with patient and she stated that is interested in participating.  Nutrition: Patient stated that her previous doctor had given her a diet sheet that is why she is eating what she is. I reinforced with patient that normally should eat 2 to 3 servings of fruits and vegetables. Omega three fish are recommended two times per week. Patient is eating recommended grains.  Informed about sugar and salt which patient is doing.  Physical Activity: Patient stated that she is walking 30 minutes a day five times a week.  Informed that minimal moderate activity is 150 minutes a week and vigorous is 90 minutes.  PLAN: Will set goal when call lab results and do second health coaching. Will think about what goals may want to set.

## 2015-06-06 LAB — LIPID PANEL
CHOLESTEROL TOTAL: 195 mg/dL (ref 100–199)
Chol/HDL Ratio: 4.4 ratio units (ref 0.0–4.4)
HDL: 44 mg/dL (ref >39)
LDL Calculated: 119 mg/dL — ABNORMAL HIGH (ref 0–99)
Triglycerides: 160 mg/dL — ABNORMAL HIGH (ref 0–149)
VLDL Cholesterol Cal: 32 mg/dL (ref 5–40)

## 2015-06-06 LAB — HEMOGLOBIN A1C
ESTIMATED AVERAGE GLUCOSE: 229 mg/dL
Hemoglobin A1c: 9.6 % — ABNORMAL HIGH (ref 4.8–5.6)

## 2015-06-09 ENCOUNTER — Telehealth: Payer: Self-pay

## 2015-06-09 NOTE — Telephone Encounter (Addendum)
LAB RESULTS: Lavon Paganini interpreter called to inform patient about lab work from 06/05/15. I informed patient: cholesterol- 195, HDL- 44, LDL- 119, triglycerides - 160,  HBG-A1C - 9.6, and BMI 30.4.  DOCTOR's APPOINTMENT: Told patient of appointment at [facility] on Thursday, June 1 at 10:30 AM for elevated blood Pressure and A1C. Reassured patient that aware that blood pressure normally is not that high but I am required to send her. Facility address and telephone number given. Patient was informed not to let doctor do any other lab work or injections. [facility] does not cover and patient would be billed. To get an eligibility appointment for orange card and financial assistance.  SECOND HEALTH COACHING:Did risk reduction Health Coaching concerning BMI. Informed that normal BMI was 18 to 25 and patient is in obese range. Discussed the need to loose weight, decrease sugar in diet and starches with examples given. Informed patient needs to stay away from drinks and food that have too much sugar. Patient asked about what did she needs to do concerning low HDL's. Discussed that needed to use good oils for cooking, eat fish high in omega 3's, and fish oil supplement. Example were given of all foods and oils. Discussed briefly increasing fiber and exercise to help lower LDL's. Discussed portion sizes and avoiding fatty food.   [facility] PATIENT NAVIGATION: Patient was referred to [place] concerning lab results of elevated Hgb A1C (9.6) and blood pressure.   NEEDS ASSESSMENT: Patient did have barriers, help understanding medical follow up needs from past, not being aware of access to services and the needed support.  PLAN of CARE; Patient now has access to services but will need to be followed up on follow through of doing Eligibility. Will need to make sure what services are available to assist with doctor payment, medications and speciality care.   Patient will be approached for  Health Coaching or other services to help with problem areas. Will discuss possible Heart Wise Program for blood pressure. Will explore possible other barriers patient may perceive.  Will need to see about follow equipment patient may need for blood sugar and blood pressure.  PLAN:  Patient will attend doctor's appointment. Will call patient back after appointment to see how appointment went and if wants to come in for one on one health coaching. Patient and I will discuss more health coaching concerning patients exercise and weight loss goals  TIME SPENT WITH PATIENT: 15 minutes.Marland Kitchen

## 2015-06-11 ENCOUNTER — Ambulatory Visit (INDEPENDENT_AMBULATORY_CARE_PROVIDER_SITE_OTHER): Payer: Self-pay | Admitting: Family Medicine

## 2015-06-11 ENCOUNTER — Encounter: Payer: Self-pay | Admitting: Family Medicine

## 2015-06-11 VITALS — BP 161/67 | HR 79 | Temp 98.8°F | Ht 65.0 in | Wt 175.2 lb

## 2015-06-11 DIAGNOSIS — I10 Essential (primary) hypertension: Secondary | ICD-10-CM | POA: Insufficient documentation

## 2015-06-11 DIAGNOSIS — E119 Type 2 diabetes mellitus without complications: Secondary | ICD-10-CM | POA: Insufficient documentation

## 2015-06-11 DIAGNOSIS — H11003 Unspecified pterygium of eye, bilateral: Secondary | ICD-10-CM

## 2015-06-11 DIAGNOSIS — H11009 Unspecified pterygium of unspecified eye: Secondary | ICD-10-CM | POA: Insufficient documentation

## 2015-06-11 DIAGNOSIS — E118 Type 2 diabetes mellitus with unspecified complications: Secondary | ICD-10-CM

## 2015-06-11 DIAGNOSIS — E785 Hyperlipidemia, unspecified: Secondary | ICD-10-CM

## 2015-06-11 MED ORDER — METFORMIN HCL 500 MG PO TABS: 500.0000 mg | ORAL_TABLET | Freq: Two times a day (BID) | ORAL | Status: DC

## 2015-06-11 MED ORDER — LISINOPRIL-HYDROCHLOROTHIAZIDE 20-12.5 MG PO TABS: 1.0000 | ORAL_TABLET | Freq: Every day | ORAL | Status: DC

## 2015-06-11 NOTE — Assessment & Plan Note (Signed)
Uncontrolled but has not been on her medication recently.  - refilled her lisinopril/HCTZ  - advised to follow up soon to check BP and labs if needed.

## 2015-06-11 NOTE — Progress Notes (Signed)
Subjective:    Angela Ritter - 45 y.o. female MRN [id number]  Date of birth: 1970/04/21  CC Dm2, HTN   HPI  Angela Ritter is here for dm2, htn.  DIABETES Newly diagnosed on 5/26.  Hemoglobin a1c of 9.6 Disease Monitoring  Blood Sugar Ranges: none  Polyuria: no   Visual problems: no   Medication Compliance: n/a  Medication Side Effects  Hypoglycemia: n/a   Preventitive Health Care  Eye Exam: last year.   Diet pattern: brkst: eggs with salmon and broccoli lunch: chicken pea soup with chicken and vegetables supper: same things as lunch.   Exercise: walks every day for 30 minutes   HTN Was diagnosed about 5 years ago.  Disease Monitoring: Home BP Monitoring  Reports being normal.  Medications: Chest pain- none     Dyspnea- none Compliance-  Yes but recently ran out since her previous doctor's office close .  Lightheadedness-  no   Edema- no  HYPERLIPIDEMIA Recently diagnosed with lab tests on 5/26 Symptoms Chest pain on exertion:  no   . Leg claudication:   no Medications: n/a  Compliance- n/a  Right upper quadrant pain- no   Muscle aches- no     Component Value Date/Time   CHOL 195 06/05/2015 1000   CHOL 192 04/25/2007 1959   TRIG 160* 06/05/2015 1000   TRIG 159* 04/25/2007 1959   HDL 44 06/05/2015 1000   HDL 41 04/25/2007 1959   VLDL 32 04/25/2007 1959   CHOLHDL 4.4 06/05/2015 1000   CHOLHDL 4.7 Ratio 04/25/2007 1959   Eye lesions:  They are have been present for 14 or 15 years.  Her prior PCP advised to have them evaluated.  She saw ophthalmologist last year in [place].  She was told that she needed to have a surgery.    PMH: HTN SH: no tobacco or alcohol use  FH: dm2  Health Maintenance:  Health Maintenance Due  Topic Date Due  . PNEUMOCOCCAL POLYSACCHARIDE VACCINE (1) 06/07/1972  . FOOT EXAM  06/07/1980  . OPHTHALMOLOGY EXAM  06/07/1980  . TETANUS/TDAP  06/07/1989    Review of Systems See HPI     Objective:   Physical Exam BP 161/67 mmHg  Pulse 79  Temp(Src) 98.8 F (37.1 C) (Oral)  Ht 5\' 5"  (1.651 m)  Wt 175 lb 3.2 oz (79.47 kg)  BMI 29.15 kg/m2 Gen: NAD, alert, cooperative with exam, well-appearing HEENT: wedge shaped growth on the medial aspect of each eye growing in the direction of the pupil.  CV: RRR, good S1/S2, no murmur, no edema, Resp: CTABL, no wheezes, non-labored Skin: no rashes, normal turgor  Neuro: no gross deficits.       Assessment & Plan:   HTN (hypertension) Uncontrolled but has not been on her medication recently.  - refilled her lisinopril/HCTZ  - advised to follow up soon to check BP and labs if needed.   DM2 (diabetes mellitus, type 2) ([place]) New diagnosis  She has a significant family history Seems to eat well and exercise  - start metformin 500 mg BID. Will titrate up as able  - may consider referral to nutrition and diabetes management upon next visit.   HLD (hyperlipidemia) Risk calculation is suggestion a moderate statin  Will consider adding in the future but with new dx of Dm2, will start one thing at a time.   Pterygium She reports a previous ophthalmologist told her she needed surgery.  - place referral to ophthalmology and see if they offer  any financial plan

## 2015-06-11 NOTE — Assessment & Plan Note (Signed)
New diagnosis  She has a significant family history Seems to eat well and exercise  - start metformin 500 mg BID. Will titrate up as able  - may consider referral to nutrition and diabetes management upon next visit.

## 2015-06-11 NOTE — Patient Instructions (Signed)
Thank you for coming in,   Por favor, siga con nosotros en un mes para revisar su presin arterial de nuevo y ver cmo usted est tolerando la metformina.  Please bring all of your medications with you to each visit.   Health maintenance items that are due.  Health Maintenance  Topic Date Due  . Pneumococcal vaccine (1) 06/07/1972  . Complete foot exam   06/07/1980  . Eye exam for diabetics  06/07/1980  . Tetanus Vaccine  06/07/1989  . Flu Shot  08/11/2015  . Hemoglobin A1C  12/06/2015  . Pap Smear  04/23/2018  . HIV Screening  Completed     Sign up for My Chart to have easy access to your labs results, and communication with your Primary care physician   Please feel free to call with any questions or concerns at any time, at [phone]. --Dr. Raeford Razor  Diet Recommendations for Diabetes   Starchy (carb) foods include: Bread, rice, pasta, potatoes, corn, crackers, bagels, muffins, all baked goods.   Protein foods include: Meat, fish, poultry, eggs, dairy foods, and beans such as pinto and kidney beans (beans also provide carbohydrate).   1. Eat at least 3 meals and 1-2 snacks per day. Never go more than 4-5 hours while awake without eating.  2. Limit starchy foods to TWO per meal and ONE per snack. ONE portion of a starchy  food is equal to the following:   - ONE slice of bread (or its equivalent, such as half of a hamburger bun).   - 1/2 cup of a "scoopable" starchy food such as potatoes or rice.   - 1 OUNCE (28 grams) of starchy snack foods such as crackers or pretzels (look on label).   - 15 grams of carbohydrate as shown on food label.  3. Both lunch and dinner should include a protein food, a carb food, and vegetables.   - Obtain twice as many veg's as protein or carbohydrate foods for both lunch and dinner.   - Try to keep frozen veg's on hand for a quick vegetable serving.     - Fresh or frozen veg's are best.  4. Breakfast should always include protein.

## 2015-06-11 NOTE — Assessment & Plan Note (Signed)
She reports a previous ophthalmologist told her she needed surgery.  - place referral to ophthalmology and see if they offer any financial plan

## 2015-06-11 NOTE — Assessment & Plan Note (Signed)
Risk calculation is suggestion a moderate statin  Will consider adding in the future but with new dx of Dm2, will start one thing at a time.

## 2015-06-18 ENCOUNTER — Ambulatory Visit: Payer: No Typology Code available for payment source

## 2015-07-01 ENCOUNTER — Telehealth: Payer: Self-pay

## 2015-07-01 NOTE — Telephone Encounter (Signed)
Called per Lavon Paganini interpreter to remind of [place] appointment for health Coaching and A1C. Address given for Cancer center at [facility].

## 2015-07-02 ENCOUNTER — Other Ambulatory Visit: Payer: No Typology Code available for payment source

## 2015-07-02 ENCOUNTER — Ambulatory Visit: Payer: No Typology Code available for payment source

## 2015-07-07 ENCOUNTER — Ambulatory Visit (INDEPENDENT_AMBULATORY_CARE_PROVIDER_SITE_OTHER): Payer: Self-pay | Admitting: Family Medicine

## 2015-07-07 VITALS — BP 134/74 | HR 79 | Temp 98.3°F | Wt 170.8 lb

## 2015-07-07 DIAGNOSIS — M546 Pain in thoracic spine: Secondary | ICD-10-CM

## 2015-07-07 DIAGNOSIS — M549 Dorsalgia, unspecified: Secondary | ICD-10-CM | POA: Insufficient documentation

## 2015-07-07 DIAGNOSIS — E118 Type 2 diabetes mellitus with unspecified complications: Secondary | ICD-10-CM

## 2015-07-07 MED ORDER — NAPROXEN 500 MG PO TABS: 500.0000 mg | ORAL_TABLET | Freq: Two times a day (BID) | ORAL | Status: DC

## 2015-07-07 NOTE — Patient Instructions (Addendum)
Thank you for coming in,   Por favor, pruebe los antiinflamatorios [employer] y vea cmo se siente.  Por favor, intente los ejercicios tambin.  Usted puede intentar a un msculo frote para ver si eso ayuda.  Please bring all of your medications with you to each visit.   Health maintenance items that are due.  Health Maintenance  Topic Date Due  . Pneumococcal vaccine (1) 06/07/1972  . Eye exam for diabetics  06/07/1980  . Tetanus Vaccine  06/07/1989  . Flu Shot  08/11/2015  . Hemoglobin A1C  12/06/2015  . Complete foot exam   06/10/2016  . Pap Smear  04/23/2018  . HIV Screening  Completed     Sign up for My Chart to have easy access to your labs results, and communication with your Primary care physician   Please feel free to call with any questions or concerns at any time, at [phone]. --Dr. Raeford Razor
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
## 2015-07-07 NOTE — Assessment & Plan Note (Signed)
Encouraged to take her medication as well as diet and exercise.
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
## 2015-07-09 ENCOUNTER — Telehealth: Payer: Self-pay

## 2015-07-09 NOTE — Telephone Encounter (Signed)
Called to see if wanted to reschedule Health coaching appointment that missed. Person who answered phone will give a message patient a message to return call.

## 2015-08-10 ENCOUNTER — Encounter: Payer: Self-pay | Admitting: Family Medicine

## 2015-08-10 ENCOUNTER — Ambulatory Visit (INDEPENDENT_AMBULATORY_CARE_PROVIDER_SITE_OTHER): Payer: Self-pay | Admitting: Family Medicine

## 2015-08-10 VITALS — BP 136/80 | HR 60 | Temp 98.2°F | Wt 170.0 lb

## 2015-08-10 DIAGNOSIS — R070 Pain in throat: Secondary | ICD-10-CM | POA: Insufficient documentation

## 2015-08-10 DIAGNOSIS — M546 Pain in thoracic spine: Secondary | ICD-10-CM

## 2015-08-10 DIAGNOSIS — R109 Unspecified abdominal pain: Secondary | ICD-10-CM

## 2015-08-10 DIAGNOSIS — E049 Nontoxic goiter, unspecified: Secondary | ICD-10-CM

## 2015-08-10 DIAGNOSIS — E010 Iodine-deficiency related diffuse (endemic) goiter: Secondary | ICD-10-CM

## 2015-08-10 LAB — POCT URINALYSIS DIPSTICK
Bilirubin, UA: NEGATIVE
Glucose, UA: >=1000
LEUKOCYTES UA: NEGATIVE
NITRITE UA: NEGATIVE
PH UA: 5.5
PROTEIN UA: NEGATIVE
Spec Grav, UA: >=1.03
UROBILINOGEN UA: 0.2

## 2015-08-10 LAB — POCT UA - MICROSCOPIC ONLY

## 2015-08-10 LAB — TSH: TSH: 0.7 mIU/L

## 2015-08-10 MED ORDER — NORTRIPTYLINE HCL 25 MG PO CAPS
25.0000 mg | ORAL_CAPSULE | Freq: Every day | ORAL | 2 refills | Status: DC
Start: 2015-08-10 — End: 2020-05-13

## 2015-08-10 MED ORDER — METFORMIN HCL 500 MG PO TABS: 500.0000 mg | ORAL_TABLET | Freq: Two times a day (BID) | ORAL | 3 refills | Status: DC

## 2015-08-10 MED ORDER — CYCLOBENZAPRINE HCL 10 MG PO TABS: 10.0000 mg | ORAL_TABLET | Freq: Three times a day (TID) | ORAL | 0 refills | Status: DC | PRN

## 2015-08-10 MED ORDER — LISINOPRIL-HYDROCHLOROTHIAZIDE 20-12.5 MG PO TABS: 1.0000 | ORAL_TABLET | Freq: Every day | ORAL | 1 refills | Status: DC

## 2015-08-10 NOTE — Progress Notes (Signed)
Subjective: CC: back pain Utilized video spanish interpreter Eyvonne Mechanic [phone] HPI: Patient is a 45 y.o. female with presenting to clinic today for a same day appt.  Throat pain: Throat pain that goes back behind the ears bilaterally but L>R. Because of this, she has headaches. This has been going on for 1 year. She saw a doctor previously who noted that it was due to stress. It makes her feel tired to talk. Mild pain with mastication. Never had swelling. Sometimes she's worried it is her tonsils. No difficult with swallowing. She has not had this worked up. No fevers or chills.   Back pain:  She's had back pain for over 1 month now.  Starts in upper back, goes down her back and into her ribcage.  Her son has marked the area where the pain is after he massaged the area today.  Naproxen has a transient affect.  She uses hot compresses that has mild improvement that is temporarily.  No saddles paresthesias  No weakness in the UEs..   ROS: All other systems reviewed and are negative besides that noted in HPI.  Past Medical History Patient Active Problem List   Diagnosis Date Noted  . Throat pain 08/10/2015  . Back pain 07/07/2015  . HTN (hypertension) 06/11/2015  . DM2 (diabetes mellitus, type 2) ([place]) 06/11/2015  . HLD (hyperlipidemia) 06/11/2015  . Pterygium 06/11/2015    Medications- reviewed and updated Current Outpatient Prescriptions  Medication Sig Dispense Refill  . b complex vitamins tablet Take 1 tablet by mouth daily. Reported on 04/23/2015    . cholecalciferol (VITAMIN D) 1000 UNITS tablet Take 1,000 Units by mouth daily. Reported on 04/23/2015    . cyclobenzaprine (FLEXERIL) 10 MG tablet Take 1 tablet (10 mg total) by mouth 3 (three) times daily as needed for muscle spasms. 30 tablet 0  . folic acid (FOLVITE) 1 MG tablet Take 1 mg by mouth daily. Reported on 04/23/2015    . IRON PO Take by mouth 2 (two) times daily. Reported on 04/23/2015    .  lisinopril-hydrochlorothiazide (PRINZIDE,ZESTORETIC) 20-12.5 MG tablet Take 1 tablet by mouth daily. 30 tablet 1  . metFORMIN (GLUCOPHAGE) 500 MG tablet Take 1 tablet (500 mg total) by mouth 2 (two) times daily with a meal. 60 tablet 3  . Multiple Vitamins-Iron (MULTIVITAMINS WITH IRON) TABS Take 1 tablet by mouth daily. Reported on 04/23/2015    . naproxen (NAPROSYN) 500 MG tablet Take 1 tablet (500 mg total) by mouth 2 (two) times daily with a meal. 28 tablet 0  . nortriptyline (PAMELOR) 25 MG capsule Take 1 capsule (25 mg total) by mouth at bedtime. Reported on 04/23/2015 30 capsule 2  . traMADol (ULTRAM) 50 MG tablet Take 1 tablet (50 mg total) by mouth every 4 (four) hours. (Patient not taking: Reported on 10/30/2014) 30 tablet 2  . vitamin E 100 UNIT capsule Take by mouth daily. Reported on 04/23/2015     No current facility-administered medications for this visit.     Objective: Office vital signs reviewed. BP 136/80 (BP Location: Left Arm, Patient Position: Sitting, Cuff Size: Normal)   Pulse 60   Temp 98.2 F (36.8 C) (Oral)   Wt 170 lb (77.1 kg)   LMP 07/14/2015   BMI 28.29 kg/m    Physical Examination:  General: Awake, alert, well- nourished, NAD ENMT:  TMs intact, normal light reflex, no erythema, no bulging. Nasal turbinates moist. MMM, Oropharynx clear without erythema or tonsillar exudate/hypertrophy. No tenderness over  the TMJ, able to bite down without pain.  Mild thyromegaly.  Eyes: Conjunctiva non-injected. PERRL.  Back: Normal to inspection. Significant tenderness over the paraspinal muscles of the thoracic region. She is noted to have significant tension and a 1cm knot in the right thoracic paraspinal muscles. Normal scapular movement. 5 out of 5 strength in the upper extremities bilaterally. Normal sensation in the upper extremities bilaterally.  Assessment/Plan: Back pain Most likely secondary to muscle spasms and strain. Noted a significant not in the right  paraspinal muscle thoracic region. No red flags on exam or history. -Provided a prescription for Flexeril. -Provided referral to physical therapy -Advised the patient that she may benefit from dry needling which can be done in our clinic versus with PT in the future. -Trichomoniasis return precautions discussed.  Throat pain Patient exam is not very consistent. She points to her thyroid when she endorses significant pain. She may have a mild thyromegaly. Will order a TSH. It would be unusual for this to cause pain.  fortunately, there is no evidence of issues with breathing or swallowing. Consider further imaging in the future.    Orders Placed This Encounter  Procedures  . TSH  . Ambulatory referral to Physical Therapy    Referral Priority:   Routine    Referral Type:   Physical Medicine    Referral Reason:   Specialty Services Required    Requested Specialty:   Physical Therapy    Number of Visits Requested:   1  . Urinalysis Dipstick  . POCT UA - Microscopic Only    Meds ordered this encounter  Medications  . cyclobenzaprine (FLEXERIL) 10 MG tablet    Sig: Take 1 tablet (10 mg total) by mouth 3 (three) times daily as needed for muscle spasms.    Dispense:  30 tablet    Refill:  0  . lisinopril-hydrochlorothiazide (PRINZIDE,ZESTORETIC) 20-12.5 MG tablet    Sig: Take 1 tablet by mouth daily.    Dispense:  30 tablet    Refill:  1  . metFORMIN (GLUCOPHAGE) 500 MG tablet    Sig: Take 1 tablet (500 mg total) by mouth 2 (two) times daily with a meal.    Dispense:  60 tablet    Refill:  3  . nortriptyline (PAMELOR) 25 MG capsule    Sig: Take 1 capsule (25 mg total) by mouth at bedtime. Reported on 04/23/2015    Dispense:  30 capsule    Refill:  [place] PGY-3, [place]

## 2015-08-10 NOTE — Patient Instructions (Signed)
Dolor de espalda en adultos (Back Pain, Adult) El dolor de espalda es muy frecuente en los adultos.La causa del dolor de espalda es rara vez peligrosa y Conservation officer, historic buildings a menudo mejora con el [place].Es posible que se desconozca la causa de esta afeccin. Algunas causas comunes son las siguientes:  Distensin de los msculos o ligamentos que sostienen la columna vertebral.  Holiday representative (degeneracin) de los discos vertebrales.  Artritis.  Lesiones directas en la espalda. En [employer], el dolor de espalda es recurrente. Como rara vez es peligroso, las personas pueden aprender a Holiday representative afeccin por s mismas. INSTRUCCIONES PARA EL CUIDADO EN EL HOGAR Controle su dolor de espalda a fin de Recruitment consultant cambio. Las siguientes indicaciones ayudarn a Chief Strategy Officer que pueda sentir:  IT consultant. Si permanece sentado o de pie en un mismo lugar durante mucho tiempo, se tensiona la espalda. No se siente, conduzca o permanezca de pie en un mismo lugar durante ms de 30 minutos seguidos. Realice caminatas cortas en superficies planas tan pronto como le sea posible.Trate de caminar un poco ms de Publishing copy.  Haga ejercicio regularmente como se lo haya indicado el mdico. El ejercicio ayuda a que su espalda se cure ms rpidamente. Tambin ayuda a prevenir futuras lesiones al [employer] fuertes y flexibles.  No permanezca en la cama.Si hace reposo ms de 1 a 2 das, puede demorar su recuperacin.  Preste atencin a su cuerpo al inclinarse y levantarse. Las posiciones ms cmodas son las que ejercen menos tensin en la espalda en recuperacin. Siempre use tcnicas apropiadas para levantar objetos, como por ejemplo:  Flexionar las rodillas.  Mantener la carga cerca del cuerpo.  No torcerse.  Encuentre una posicin cmoda para dormir. Use un colchn firme y recustese de costado con las rodillas ligeramente flexionadas. Si se recuesta [employer], coloque  una almohada debajo de las rodillas.  Evite sentir ansiedad o estrs.El estrs aumenta la tensin muscular y puede empeorar el dolor de espalda.Es importante reconocer si se siente ansioso o estresado y aprender maneras de controlarlo, por ejemplo haciendo ejercicio.  Tome los medicamentos solamente como se lo haya indicado el mdico. Los medicamentos de venta libre para Best boy y la inflamacin a menudo son los ms eficaces.El mdico puede recetarle relajantes musculares.Estos medicamentos ayudan a Glass blower/designer de modo que pueda reanudar ms rpidamente sus actividades normales y el ejercicio saludable.  Aplique hielo sobre la zona lesionada.  Ponga el hielo en una bolsa plstica.  Coloque una toalla entre la piel y la bolsa de hielo.  Deje el hielo durante 34minutos, 2 a 3veces por da, durante los primeros 2 o 3das. Despus de eso, puede alternar el hielo y el calor para reducir Conservation officer, historic buildings y los espasmos.  Mantenga un peso saludable. El exceso de peso ejerce presin adicional sobre la espalda y hace que resulte difcil mantener una buena [place]. SOLICITE ATENCIN MDICA SI:  Siente un dolor que no se alivia con reposo o medicamentos.  Siente mucho dolor que se extiende a las piernas o los glteos.  El dolor no mejora en una semana.  Siente dolor por la noche.  Pierde peso.  Siente escalofros o fiebre. SOLICITE ATENCIN MDICA DE INMEDIATO SI:   Tiene nuevos problemas para controlar la vejiga o los intestinos.  Siente debilidad o adormecimiento inusuales en los brazos o en las piernas.  Siente nuseas o vmitos.  Siente dolor abdominal.  Siente que va a desmayarse.  Esta informacin no tiene Marine scientist el consejo del mdico. Asegrese de hacerle al mdico cualquier pregunta que tenga.   Document Released: 12/27/2004 Document Revised: 01/17/2014 Elsevier Interactive Patient Education [employer].

## 2015-08-10 NOTE — Assessment & Plan Note (Addendum)
Patient exam is not very consistent. She points to her thyroid when she endorses significant pain. She may have a mild thyromegaly. Will order a TSH. It would be unusual for this to cause pain.  fortunately, there is no evidence of issues with breathing or swallowing. Consider further imaging in the future.

## 2015-08-10 NOTE — Assessment & Plan Note (Signed)
Most likely secondary to muscle spasms and strain. Noted a significant not in the right paraspinal muscle thoracic region. No red flags on exam or history. -Provided a prescription for Flexeril. -Provided referral to physical therapy -Advised the patient that she may benefit from dry needling which can be done in our clinic versus with PT in the future. -Trichomoniasis return precautions discussed.

## 2015-08-13 ENCOUNTER — Telehealth: Payer: Self-pay

## 2015-08-13 NOTE — Telephone Encounter (Signed)
[place] Patient called per phone today for Health Coaching with interpreter Anastasio Auerbach present. Patient's areas of concern were weight, LDL, triglycerides, A1C, and exercise.  HEALTH COACHING: Patient stated had decreased the amount of bread, rice, sugar and grease she is eating. Per patient has increased the amount of vegetables that she is eating. Patient stated that is drinking a lot of water. Patient stated is taking her medication. When asked about eligibility appointment patient stated that has one set up. Patient  Patient stated that is exercising 40 minutes on most days. Patient states is felling much better.  PLAN: Will Call in 3 to 4 weeks. Will continue with increasing exercise. Will continue with the good meal and nutrition changes. Will be doing follow up assessment when call next.  TIME: 15 minutes

## 2015-08-18 ENCOUNTER — Ambulatory Visit: Payer: No Typology Code available for payment source | Admitting: Physical Therapy

## 2015-08-19 ENCOUNTER — Telehealth: Payer: Self-pay | Admitting: Family Medicine

## 2015-08-19 NOTE — Telephone Encounter (Signed)
Attempted to call patient concerning lab result using [place] interpreter [phone].   No answer.  Thyroid function normal. IF patient continues to have throat pain please have her f/u in our clinic as soon as possible.  [employer]

## 2015-09-01 ENCOUNTER — Ambulatory Visit: Payer: No Typology Code available for payment source

## 2015-09-02 ENCOUNTER — Ambulatory Visit: Payer: No Typology Code available for payment source | Attending: Family Medicine | Admitting: Physical Therapy

## 2015-09-24 ENCOUNTER — Ambulatory Visit: Payer: No Typology Code available for payment source

## 2015-10-02 ENCOUNTER — Ambulatory Visit: Payer: No Typology Code available for payment source | Attending: Internal Medicine

## 2015-10-21 ENCOUNTER — Ambulatory Visit (INDEPENDENT_AMBULATORY_CARE_PROVIDER_SITE_OTHER): Payer: Self-pay | Admitting: Student

## 2015-10-21 ENCOUNTER — Encounter: Payer: Self-pay | Admitting: Student

## 2015-10-21 VITALS — BP 128/90 | HR 76 | Temp 98.1°F | Ht 65.0 in | Wt 161.8 lb

## 2015-10-21 DIAGNOSIS — R51 Headache: Secondary | ICD-10-CM

## 2015-10-21 DIAGNOSIS — R519 Headache, unspecified: Secondary | ICD-10-CM | POA: Insufficient documentation

## 2015-10-21 LAB — POCT SEDIMENTATION RATE: POCT SED RATE: 23 mm/h — AB (ref 0–22)

## 2015-10-21 NOTE — Patient Instructions (Signed)
It was great seeing you today! We have addressed the following issues today  1. Ear and neck pain: I have ordered a blood test today. If the results is  abnormal someone will get in touch with you. I recommend follow-up with your primary care doctor on this   If we did any lab work today, and the results require attention, either me or my nurse will get in touch with you. If everything is normal, you will get a letter in mail. If you don't hear from [place] in two weeks, please give [place] a call. Otherwise, I look forward to talking with you again at our next visit. If you have any questions or concerns before then, please call the clinic at [phone].  Please bring all your medications to every doctors visit   Sign up for My Chart to have easy access to your labs results, and communication with your Primary care physician.    Please check-out at the front desk before leaving the clinic.   Take Care,

## 2015-10-21 NOTE — Progress Notes (Signed)
   Subjective:    Patient ID: Angela Ritter is a 45 y.o. old female. Video interpreter with ID number [id number] was used for this encounter.   HPI #Head and neck pain: this has been going on for one year. Started suddenly. Denies trauma or ear or head surgery. Pain from her left temple to her throat. Describes pain as dull.  Pain is constant and every day. Pain is not related to chewing. No hearing problem. She is here because she is tired of pain medicine that is not helping and the pain is not is going away.  Denies fever, night sweat, unintentional weight loss, dizziness or tinnitus. No vision changes. Denies nausea or vomiting.  Reports drainage of clear fluid from both ears on q-tips.   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 Denies stressors.   Review of Systems Per HPI Objective:   Vitals:   10/21/15 1154  BP: 128/90  Pulse: 76  Temp: 98.1 F (36.7 C)  TempSrc: Oral  SpO2: 98%  Weight: 161 lb 12.8 oz (73.4 kg)  Height: 5' 5" (1.651 m)    GEN: appears well, no apparent distress. HEENT:   Head: normocephalic and atraumatic. Tender to palpation over her left temporal area.   Eyes: without conjunctival injection, sclera anicteric  Ears: normal TM and ear canal, no pain with tug on pinnae or pressure on tragus.   Oropharynx: mmm without erythema or exudation CVS: RRR, normal s1 and s2, no murmurs, no edema, negative for carotid bruits RESP: no increased work of breathing MSK: No apparent swelling, neck appears symmetric bilaterally, pain over left neck with head tilt to the right HEM: Negative for cervical and periauricular lymphadenopathy NEURO: alert and oriented appropriately, no gross defecits  PSYCH: appropriate mood and affect     Assessment & Plan:  Left sided head and neck pain It is unclear what is causing her pain. Tenderness to palpation over her temporal area concerning for temporal arteritis however ESR came back 23 (normal < 22). The distribution of the pain  (from her temporal area down to her neck on the left side) is also concerning for trigeminal neuralgia, V3 distribution although the nature of the pain doesn't suggest that. She was also was prescribed nortriptyline although it is unclear if she is taking.  Other possibility is TMJ arthritis but pain is not related to chewing.   Patient has no working phone. I talked to her daughter Angela Ritter at [phone]. Angela Ritter says patient's for are not working to call us through the interpreter service. Angela Ritter is away from her mother at the moment. I recommended follow-up with her PCP. I think it is worthwhile to try carbamazepine for possible trigeminal neuralgia.  Patient was precepted with Dr. McIntyre who was the preceptor for the day 

## 2015-10-21 NOTE — Assessment & Plan Note (Addendum)
It is unclear what is causing her pain. Tenderness to palpation over her temporal area concerning for temporal arteritis however ESR came back 23 (normal < 22). The distribution of the pain (from her temporal area down to her neck on the left side) is also concerning for trigeminal neuralgia, V3 distribution although the nature of the pain doesn't suggest that. She was also was prescribed nortriptyline although it is unclear if she is taking.  Other possibility is TMJ arthritis but pain is not related to chewing.   Patient has no working phone. I talked to her daughter Angela Nevin at [phone]. Angela Nevin says patient's for are not working to call us through the interpreter service. Angela Nevin is away from her mother at the moment. I recommended follow-up with her PCP. I think it is worthwhile to try carbamazepine for possible trigeminal neuralgia.

## 2015-10-27 ENCOUNTER — Telehealth (HOSPITAL_COMMUNITY): Payer: Self-pay | Admitting: *Deleted

## 2015-10-27 NOTE — Telephone Encounter (Signed)
Called patient with interpreter Angela Ritter to discuss the bill that patient brought to me. Explained the bill is not covered by [employer] or [place]. Informed patient that her first visit 06/11/2015 at [facility] was covered by [facility]. Explained the bill received for for her physician visits on 07/07/15 and 08/10/15 is not covered. Let her know only the first visit was covered and that she would need to complete financial assistance paperwork for her other visits. Gave patient number to patient accounting to see if eligible for financial assistance or what options she has. Patient verbalized understanding.

## 2015-12-16 ENCOUNTER — Encounter: Payer: Self-pay | Admitting: Internal Medicine

## 2015-12-16 ENCOUNTER — Ambulatory Visit (INDEPENDENT_AMBULATORY_CARE_PROVIDER_SITE_OTHER): Payer: Self-pay | Admitting: Internal Medicine

## 2015-12-16 VITALS — BP 122/78 | HR 79 | Temp 98.1°F | Ht 65.0 in | Wt 162.0 lb

## 2015-12-16 DIAGNOSIS — B354 Tinea corporis: Secondary | ICD-10-CM

## 2015-12-16 LAB — POCT SKIN KOH: SKIN KOH, POC: NEGATIVE

## 2015-12-16 MED ORDER — LISINOPRIL-HYDROCHLOROTHIAZIDE 20-12.5 MG PO TABS: 1.0000 | ORAL_TABLET | Freq: Every day | ORAL | 1 refills | Status: DC

## 2015-12-16 MED ORDER — METFORMIN HCL 500 MG PO TABS: 500.0000 mg | ORAL_TABLET | Freq: Two times a day (BID) | ORAL | 3 refills | Status: DC

## 2015-12-16 MED ORDER — TERBINAFINE HCL 250 MG PO TABS: 250.0000 mg | ORAL_TABLET | Freq: Every day | ORAL | 0 refills | Status: DC

## 2015-12-16 NOTE — Patient Instructions (Signed)
Tia corporal (Body Ringworm) La tia corporal es una infeccin de la piel que suele causar una erupcin en forma de anillos. Puede afectar cualquier zona de la piel. Esta afeccin puede propagarse fcilmente a Producer, television/film/video. A la tia corporal tambin se la conoce como tinea corporis. CAUSAS Esta afeccin es causada por hongos llamados dermatofitos. Se manifiesta cuando estos hongos crecen sin control en la piel. Es posible contagiarse la afeccin al tocar a Furniture conservator/restorer persona o un animal que la tiene. Tambin, al [employer], ropa de cama, toallas o cualquier otro objeto con Furniture conservator/restorer persona o una mascota infectada. FACTORES DE RIESGO Es ms probable que esta afeccin se manifieste en:  Los atletas que suelen tener contacto piel a piel con otros atletas, como los luchadores.  Las personas que comparten equipos y Lawyer.  Las personas con el sistema inmunitario debilitado. SNTOMAS Los sntomas de esta afeccin incluyen lo siguiente:  Manchas y bultos rojos elevados que causan picazn.  Manchas rojas escamosas.  Una erupcin en forma de anillos. La erupcin cutnea puede consistir en lo siguiente:  Un centro claro.  Escamas o bultos rojos en el medio.  Enrojecimiento cerca de los bordes.  Piel seca y escamosa dentro o alrededor de ella. DIAGNSTICO Generalmente, esta afeccin puede diagnosticarse con un examen de la piel. Se puede tomar una muestra de la piel de la zona afectada y examinarla con un microscopio para determinar si hay hongos. TRATAMIENTO El tratamiento de esta afeccin puede incluir lo siguiente:  Ardelia Mems crema o una pomada antimictica.  Un champ antimictico.  Medicamentos antimicticos. Le pueden recetar estos productos si la tia es grave, si reaparece o si se prolonga durante mucho tiempo. [place] los medicamentos de venta libre y los recetados solamente como se lo haya indicado el mdico.  Si le indicaron una crema o  una pomada antimictica:  selo como se lo haya indicado el mdico.  Lave el rea de la infeccin y squela bien antes de aplicar la crema o la pomada.  Si le indicaron un champ antimictico:  selo como se lo haya indicado el mdico.  Deje actuar el champ en el cuerpo durante 3 a 61minutos antes de enjuagarlo.  Mientras tiene la erupcin:  Use ropa suelta para evitar roces e irritacin.  Lave o cambie las sbanas cada noche.  Si su mascota tiene la misma infeccin, llvela al veterinario. PREVENCIN  Mantenga una buena higiene.  Use sandalias o zapatos en lugares pblicos y duchas.  No comparta los artculos de uso personal con Producer, television/film/video.  Evite tocar las manchas rojas de piel de Producer, television/film/video.  No toque las mascotas que tienen zonas sin pelos.  Si lo hace, lvese las manos. SOLICITE ATENCIN MDICA SI:  La erupcin contina diseminndose despus de 7 das de [place].  No se cura en el trmino de 4 semanas.  El rea alrededor de la erupcin se vuelve roja, est caliente al tacto, le duele o se hincha. Esta informacin no tiene Marine scientist el consejo del mdico. Asegrese de hacerle al mdico cualquier pregunta que tenga. Document Released: 10/06/2004 Document Revised: 04/20/2015 Document Reviewed: 10/23/2014 Elsevier Interactive Patient Education  2017 [employer].

## 2015-12-16 NOTE — Progress Notes (Signed)
   [facility] Phone: [phone]   Date of Visit: 12/16/2015   HPI:  Patient is spanish speaking. Phone interpreter used for visit # [phone] - Reports of "hives"/rash on body for about 2.5 weeks; she wwas working in her yard blowing the leaves and then noticed the rash two days after.  - started in her neck then spread to abdomen and back  - she was putting bleach" and it burt her skin. She tried this because someone told her rash looked like is was poison ivy. She used the bleech Monday and Tuesday. Also tried caladryl lotion which has not helped - the rash is only slightly itchy and is not painful  - no new soaps, detergents, or lotions - no bug bites or tic bites that she has noticed - reports that her dog had a skin infection recently.  ROS: See HPI.  [place]:  DM2 HTN  PHYSICAL EXAM: BP 122/78 (BP Location: Right Arm, Patient Position: Sitting, Cuff Size: Normal)   Pulse 79   Temp 98.1 F (36.7 C) (Oral)   Ht 5\' 5"  (1.651 m)   Wt 162 lb (73.5 kg)   LMP 11/24/2015 (Exact Date)   SpO2 97%   BMI 26.96 kg/m  GEN: NAD PULM: normal effort SKIN: oval mildly erythematous scaling patch/plaque on her breasts and on her upper back. It has raised borders. On the right upper chest near the right clavicle, there is erythematous patch (more erythematous than the rest) with peeling of skin which she reports where the bleach burned her. No sign of infection.  EXTR: No lower extremity edema or calf tenderness PSYCH: Mood and affect euthymic, normal rate and volume of speech NEURO: Awake, alert, no focal deficits grossly, normal speech   ASSESSMENT/PLAN:  1. Tinea corporis Clinically consistent with tinea corporis although fungal stain was negative. Due to the extent of the rash, will do oral treatment instead of topical. Follow up in 3 weeks if symptoms do not resolve.  - terbinafine (LAMISIL) 250 MG tablet; Take 1 tablet (250 mg total) by mouth daily.   Dispense: 14 tablet; Refill: 0 - POCT Skin KOH (negative)   FOLLOW UP:   Smiley Houseman, MD PGY [place]

## 2015-12-23 ENCOUNTER — Encounter: Payer: Self-pay | Admitting: Family Medicine

## 2015-12-23 ENCOUNTER — Ambulatory Visit (INDEPENDENT_AMBULATORY_CARE_PROVIDER_SITE_OTHER): Payer: Self-pay | Admitting: Family Medicine

## 2015-12-23 VITALS — BP 120/68 | HR 69 | Temp 97.8°F | Ht 65.0 in | Wt 162.8 lb

## 2015-12-23 DIAGNOSIS — R21 Rash and other nonspecific skin eruption: Secondary | ICD-10-CM | POA: Insufficient documentation

## 2015-12-23 DIAGNOSIS — H11003 Unspecified pterygium of eye, bilateral: Secondary | ICD-10-CM

## 2015-12-23 DIAGNOSIS — Z23 Encounter for immunization: Secondary | ICD-10-CM

## 2015-12-23 MED ORDER — HYDROXYZINE HCL 10 MG PO TABS: 10.0000 mg | ORAL_TABLET | Freq: Three times a day (TID) | ORAL | 0 refills | Status: DC | PRN

## 2015-12-23 MED ORDER — TRIAMCINOLONE ACETONIDE 0.1 % EX CREA: 1.0000 "application " | TOPICAL_CREAM | Freq: Two times a day (BID) | CUTANEOUS | 0 refills | Status: DC

## 2015-12-23 NOTE — Assessment & Plan Note (Signed)
Exam seems more consistent with tinea versicolor or contact dermatitis rather than tinea corporis. No evidence of superimposed bacterial infection. Dicussed stopping Lamisil. Advised applying selenium sulfide to rash and rinsing off after 10 minutes for the next week. If no improvement, have sent in a Rx for triamcinolone cream to apply BID. Rx for hydroxyzine qHS (discussed it can make her drowsy).

## 2015-12-23 NOTE — Assessment & Plan Note (Signed)
Pictures are in clinic note from 06/11/15. Given growth and irritation, will refer to ophthalmology for possible excision.

## 2015-12-23 NOTE — Progress Notes (Signed)
Subjective: CC: rash and "cataracts" HPI: Patient is a 45 y.o. female presenting to clinic today for concerns for rash and "cataracts". [place] interpreter.   Rash: she had welts and hives on her body for the last 3.5 weeks. They are erythematous and raised.  No drainage noted. Notes that they were initially on anteriror neck and chest, now they're on the back of her neck and back. Very pruritic and worse at night. Not painful. No drainage.  She still has 5 days more of the Lamisil.  No fevers or chills. No new soaps, detergents, or lotions No bug bites or tick bites that she has noticed She was seen on 12/6, had a negative KOH, however given the presentation, was treated for tinea corporis with PO Lamisil. She notes this has not helped her symptoms at all. She still have 5 of the 14 day course to complete.  "Cataracts": pt notes she's had this for 20 years and progressively getting worse. Note she was told she had a film in her eyes and she's the one who diagnosed herself with cataracts. States in [place] she was told that it was too thin to have it removed at that time.  She's worried as it is "reaching the pupil." They have become really irritated and burn.    Social History: non smoker  Flu Vaccine: no, will get today     ROS: All other systems reviewed and are negative.  Past Medical History Patient Active Problem List   Diagnosis Date Noted  . Rash and nonspecific skin eruption 12/23/2015  . Headache 10/21/2015  . Throat pain 08/10/2015  . Back pain 07/07/2015  . HTN (hypertension) 06/11/2015  . DM2 (diabetes mellitus, type 2) ([place]) 06/11/2015  . HLD (hyperlipidemia) 06/11/2015  . Pterygium 06/11/2015    Medications- reviewed and updated Current Outpatient Prescriptions  Medication Sig Dispense Refill  . b complex vitamins tablet Take 1 tablet by mouth daily. Reported on 04/23/2015    . cholecalciferol (VITAMIN D) 1000 UNITS tablet Take 1,000 Units  by mouth daily. Reported on 04/23/2015    . cyclobenzaprine (FLEXERIL) 10 MG tablet Take 1 tablet (10 mg total) by mouth 3 (three) times daily as needed for muscle spasms. 30 tablet 0  . folic acid (FOLVITE) 1 MG tablet Take 1 mg by mouth daily. Reported on 04/23/2015    . hydrOXYzine (ATARAX/VISTARIL) 10 MG tablet Take 1 tablet (10 mg total) by mouth 3 (three) times daily as needed. 30 tablet 0  . IRON PO Take by mouth 2 (two) times daily. Reported on 04/23/2015    . lisinopril-hydrochlorothiazide (PRINZIDE,ZESTORETIC) 20-12.5 MG tablet Take 1 tablet by mouth daily. 30 tablet 1  . metFORMIN (GLUCOPHAGE) 500 MG tablet Take 1 tablet (500 mg total) by mouth 2 (two) times daily with a meal. 60 tablet 3  . Multiple Vitamins-Iron (MULTIVITAMINS WITH IRON) TABS Take 1 tablet by mouth daily. Reported on 04/23/2015    . naproxen (NAPROSYN) 500 MG tablet Take 1 tablet (500 mg total) by mouth 2 (two) times daily with a meal. 28 tablet 0  . nortriptyline (PAMELOR) 25 MG capsule Take 1 capsule (25 mg total) by mouth at bedtime. Reported on 04/23/2015 30 capsule 2  . terbinafine (LAMISIL) 250 MG tablet Take 1 tablet (250 mg total) by mouth daily. 14 tablet 0  . traMADol (ULTRAM) 50 MG tablet Take 1 tablet (50 mg total) by mouth every 4 (four) hours. (Patient not taking: Reported on 10/30/2014) 30 tablet  2  . triamcinolone cream (KENALOG) 0.1 % Apply 1 application topically 2 (two) times daily. 30 g 0  . vitamin E 100 UNIT capsule Take by mouth daily. Reported on 04/23/2015     No current facility-administered medications for this visit.     Objective: Office vital signs reviewed. BP 120/68   Pulse 69   Temp 97.8 F (36.6 C) (Oral)   Ht 5\' 5"  (1.651 m)   Wt 162 lb 12.8 oz (73.8 kg)   LMP 11/24/2015 (Exact Date)   BMI 27.09 kg/m    Physical Examination:  General: Awake, alert, well- nourished, NAD Eyes: thin opaque, wedge-shaped film with mild erythema originating in the medial canthus towards the pupils  bilaterally. Cardio: RRR, no m/r/g noted.  Pulm: No increased WOB.  CTAB, without wheezes, rhonchi or crackles noted.  Skin: erythematous, raised papules (some coalescing) with mild scaling overlying a few areas noted over anterior chest wall, under breast, on neck, and on back. No rash anywhere else noted. No drainage noted.    Assessment/Plan: Pterygium Pictures are in clinic note from 06/11/15. Given growth and irritation, will refer to ophthalmology for possible excision.   Rash and nonspecific skin eruption Exam seems more consistent with tinea versicolor or contact dermatitis rather than tinea corporis. No evidence of superimposed bacterial infection. Dicussed stopping Lamisil. Advised applying selenium sulfide to rash and rinsing off after 10 minutes for the next week. If no improvement, have sent in a Rx for triamcinolone cream to apply BID. Rx for hydroxyzine qHS (discussed it can make her drowsy).   Orders Placed This Encounter  Procedures  . Flu Vaccine QUAD 36+ mos IM  . Ambulatory referral to Ophthalmology    Referral Priority:   Routine    Referral Type:   Consultation    Referral Reason:   Specialty Services Required    Requested Specialty:   Ophthalmology    Number of Visits Requested:   1    Meds ordered this encounter  Medications  . triamcinolone cream (KENALOG) 0.1 %    Sig: Apply 1 application topically 2 (two) times daily.    Dispense:  30 g    Refill:  0  . hydrOXYzine (ATARAX/VISTARIL) 10 MG tablet    Sig: Take 1 tablet (10 mg total) by mouth 3 (three) times daily as needed.    Dispense:  30 tablet    Refill:  [place] PGY-3, [place]

## 2015-12-23 NOTE — Patient Instructions (Addendum)
Aplique Selsun shampoo en el rea afectada diariamente por una semana. El champ se enjuaga despus de 10 minutos.   Tambin le recet una crema de esteroda para aplicar a la piel si el champ no ayuda.   Le he recetado [employer], hidroxicina, para ayudar con la picazn por la noche. Te he remitido al [place].  Un terigion, que es un crecimiento carnoso no canceroso (benigno) en la superficie de adelante del ojo. El terigion Pharmacologist a TEFL teacher tejido transparente externo del ojo (conjuntiva). Luego, se extiende y cubre la parte blanca del ojo (esclertica), y avanza hasta el tejido transparente que cubre la pupila (crnea).  En ingles, el Valrie Hart is "Pterygium."

## 2016-01-06 ENCOUNTER — Other Ambulatory Visit: Payer: Self-pay | Admitting: Family Medicine

## 2016-01-14 ENCOUNTER — Encounter: Payer: Self-pay | Admitting: Family Medicine

## 2016-01-14 ENCOUNTER — Ambulatory Visit (INDEPENDENT_AMBULATORY_CARE_PROVIDER_SITE_OTHER): Payer: Self-pay | Admitting: Family Medicine

## 2016-01-14 VITALS — BP 100/62 | HR 80 | Temp 97.3°F | Wt 158.0 lb

## 2016-01-14 DIAGNOSIS — J029 Acute pharyngitis, unspecified: Secondary | ICD-10-CM

## 2016-01-14 LAB — POCT RAPID STREP A (OFFICE): RAPID STREP A SCREEN: NEGATIVE

## 2016-01-14 MED ORDER — IPRATROPIUM BROMIDE 0.03 % NA SOLN: 2.0000 | Freq: Two times a day (BID) | NASAL | 12 refills | Status: DC

## 2016-01-14 MED ORDER — OXYMETAZOLINE HCL 0.05 % NA SOLN: 1.0000 | Freq: Two times a day (BID) | NASAL | 0 refills | Status: DC

## 2016-01-14 NOTE — Patient Instructions (Signed)
Infeccin del tracto respiratorio superior, adultos (Upper Respiratory Infection, Adult) La mayora de las infecciones del tracto respiratorio superior estn causadas por un virus. Un infeccin del tracto respiratorio superior afecta la nariz, la garganta y las vas respiratorias superiores. El tipo ms comn de infeccin del tracto respiratorio superior es el resfro comn. [place] los medicamentos solamente como se lo haya indicado el mdico.  A fin de Best boy de garganta, haga grgaras con solucin salina templada o consuma caramelos para la tos, como se lo haya indicado el mdico.  Use un humidificador de vapor clido o inhale el vapor de la ducha para aumentar la humedad del aire. Esto facilitar la respiracin.  Beba suficiente lquido para mantener el pis (orina) claro o de color [place] plido.  Tome sopas y caldos transparentes.  Siga una dieta saludable.  Descanse todo lo que sea necesario.  Regrese al [place] cuando la fiebre haya desaparecido o el mdico le diga que puede [place].  Es posible que deba quedarse en su casa durante un tiempo prolongado, para no transmitir la infeccin a los dems.  [place] usar un barbijo y lavarse las manos con frecuencia para evitar el contagio del virus.  Si tiene asma, use el inhalador con mayor frecuencia.  No consuma ningn producto que contenga tabaco, lo que incluye cigarrillos, tabaco de Higher education careers adviser o Psychologist, sport and exercise. Si necesita ayuda para dejar de fumar, consulte al mdico. SOLICITE AYUDA SI:  Siente que empeora o que no mejora.  Los medicamentos no logran [employer].  Tiene escalofros.  La dificultad para Museum/gallery exhibitions officer.  Tiene mucosidad marrn o roja.  Tiene una secrecin amarilla o marrn de la Lawyer.  Le duele la cara, especialmente al inclinarse hacia adelante.  Tiene fiebre.  Tiene los ganglios del cuello hinchados.  Siente dolor al tragar.  Tiene zonas  blancas en la parte de atrs de la garganta. SOLICITE AYUDA DE INMEDIATO SI:  Los siguientes sntomas son muy intensos o constantes:  Dolor de [place].  Dolor de odos.  Dolor en la frente, detrs de los ojos y por encima de los pmulos (dolor sinusal).  Dolor en el pecho.  Tiene enfermedad pulmonar prolongada (crnica) y cualquiera de estos sntomas:  Sibilancias.  Tos prolongada.  Tos con sangre.  Cambio en la mucosidad habitual.  Presenta rigidez en el cuello.  Tiene cambios en:  La visin.  La audicin.  El pensamiento.  El [place] de nimo. ASEGRESE DE QUE:  Comprende estas instrucciones.  Controlar su afeccin.  Recibir ayuda de inmediato si no mejora o si empeora. Esta informacin no tiene Marine scientist el consejo del mdico. Asegrese de hacerle al mdico cualquier pregunta que tenga. Document Released: 05/31/2010 Document Revised: 05/13/2014 Document Reviewed: 04/03/2013 Elsevier Interactive Patient Education  2017 [employer].

## 2016-01-14 NOTE — Progress Notes (Signed)
    Subjective:  Angela Ritter is a 46 y.o. female who presents to the [facility] today with a chief complaint of sore throat.   HPI:  Sore Throat Symptoms started yesterday. Associated symptoms include left sided ear pain and fever yesterday. No cough. No runny nose. Has other family members that have been sick. Tried tylenol which helped. No difficulty speaking, eating, or swallowing.   ROS: Per HPI  PMH: Smoking history reviewed.    Objective:  Physical Exam: BP 100/62   Pulse 80   Temp 97.3 F (36.3 C) (Oral)   Wt 158 lb (71.7 kg)   LMP 12/23/2015   SpO2 98%   BMI 26.29 kg/m   Gen: NAD, resting comfortably HEENT: TMs clear bilaterally. Mild tonsilar enlargement noted without exudates. OP otherwise clear. No LAD.  CV: RRR with no murmurs appreciated Pulm: NWOB, CTAB with no crackles, wheezes, or rhonchi MSK: no edema, cyanosis, or clubbing noted Skin: warm, dry Neuro: grossly normal, moves all extremities Psych: Normal affect and thought content  Assessment/Plan:  Viral URI Rapid strep negative. Likely viral URI. Discussed typical course of illness with patient. Will treat with afrin for 3 days and atrovent. Return precautions reviewed. Follow up as needed.   Algis Greenhouse. Jerline Pain, [place] Resident PGY-3 01/14/2016 5:19 PM

## 2016-02-23 ENCOUNTER — Other Ambulatory Visit: Payer: Self-pay | Admitting: Family Medicine

## 2016-02-23 NOTE — Telephone Encounter (Signed)
Refill request for Lisinopril and Metformin

## 2016-02-24 ENCOUNTER — Other Ambulatory Visit: Payer: Self-pay | Admitting: Family Medicine

## 2016-02-24 MED ORDER — METFORMIN HCL 500 MG PO TABS: 500.0000 mg | ORAL_TABLET | Freq: Two times a day (BID) | ORAL | 3 refills | Status: DC

## 2016-02-24 MED ORDER — LISINOPRIL-HYDROCHLOROTHIAZIDE 20-12.5 MG PO TABS: 1.0000 | ORAL_TABLET | Freq: Every day | ORAL | 1 refills | Status: DC

## 2016-02-24 NOTE — Telephone Encounter (Signed)
Needs refill on blood pressure- lisonpril and metformin HCTZ.  [facility] on [address]

## 2016-02-25 MED ORDER — LISINOPRIL-HYDROCHLOROTHIAZIDE 20-12.5 MG PO TABS: 1.0000 | ORAL_TABLET | Freq: Every day | ORAL | 1 refills | Status: AC

## 2016-02-25 MED ORDER — METFORMIN HCL 500 MG PO TABS: 500.0000 mg | ORAL_TABLET | Freq: Two times a day (BID) | ORAL | 3 refills | Status: DC

## 2016-10-24 ENCOUNTER — Encounter (HOSPITAL_COMMUNITY): Payer: Self-pay

## 2020-05-13 ENCOUNTER — Ambulatory Visit (INDEPENDENT_AMBULATORY_CARE_PROVIDER_SITE_OTHER): Payer: Self-pay | Admitting: Obstetrics & Gynecology

## 2020-05-13 ENCOUNTER — Other Ambulatory Visit: Payer: Self-pay

## 2020-05-13 ENCOUNTER — Encounter: Payer: Self-pay | Admitting: Obstetrics & Gynecology

## 2020-05-13 VITALS — BP 130/82 | Ht 63.5 in | Wt 156.0 lb

## 2020-05-13 DIAGNOSIS — N914 Secondary oligomenorrhea: Secondary | ICD-10-CM

## 2020-05-13 DIAGNOSIS — R1032 Left lower quadrant pain: Secondary | ICD-10-CM

## 2020-05-13 NOTE — Progress Notes (Signed)
    Angela Ritter 1970-05-18 [id number]   History:    50 y.o. G3P3L3 Married.  Children 30, 25 and 20.  Has grand-children.  RP:  New patient presenting for irregular menses/LLQ pain  HPI: Irregular menstrual periods spacing to 2-3 months x >1 year.  LMP 03/12/2020.  Intermittent hot flushes and insomnia x about 1 year.  H/O ovulatory ovarian cysts.  No h/o gyn surgery.  Intermittent LLQ pain, no current pain.  No pain with IC.  Husband using condoms.    Past medical history,surgical history, family history and social history were all reviewed and documented in the EPIC chart.  Gynecologic History Patient's last menstrual period was 03/12/2020.  Obstetric History OB History  Gravida Para Term Preterm AB Living  3 3 3     3   SAB IAB Ectopic Multiple Live Births               # Outcome Date GA Lbr Len/2nd Weight Sex Delivery Anes PTL Lv  3 Term           2 Term           1 Term              ROS: A ROS was performed and pertinent positives and negatives are included in the history.  GENERAL: No fevers or chills. HEENT: No change in vision, no earache, sore throat or sinus congestion. NECK: No pain or stiffness. CARDIOVASCULAR: No chest pain or pressure. No palpitations. PULMONARY: No shortness of breath, cough or wheeze. GASTROINTESTINAL: No abdominal pain, nausea, vomiting or diarrhea, melena or bright red blood per rectum. GENITOURINARY: No urinary frequency, urgency, hesitancy or dysuria. MUSCULOSKELETAL: No joint or muscle pain, no back pain, no recent trauma. DERMATOLOGIC: No rash, no itching, no lesions. ENDOCRINE: No polyuria, polydipsia, no heat or cold intolerance. No recent change in weight. HEMATOLOGICAL: No anemia or easy bruising or bleeding. NEUROLOGIC: No headache, seizures, numbness, tingling or weakness. PSYCHIATRIC: No depression, no loss of interest in normal activity or change in sleep pattern.     Exam:   BP 130/82   Ht 5' 3.5" (1.613 m)   Wt 156 lb (70.8  kg)   LMP 03/12/2020   BMI 27.20 kg/m   Body mass index is 27.2 kg/m.  Abdomen: Normal  Pelvic: Vulva: Normal             Vagina: No gross lesions or discharge  Cervix: No gross lesions or discharge.  No blood.  Uterus  AV, normal size, shape and consistency, non-tender and mobile  Adnexa  Without masses or tenderness  Anus: Normal   Assessment/Plan:  50 y.o. female   1. Secondary oligomenorrhea Menstrual periods spaced to 2 to 3 months with intermittent hot flashes and insomnia for a year.  Probably perimenopausal.  Will rule out thyroid dysfunction with a TSH, hyperprolactinemia with a prolactin level and menopausal status with an [place].  Follow-up pelvic ultrasound to evaluate the endometrial lining. - TSH - Prolactin - FSH - US Transvaginal Non-OB; Future  2. Left lower quadrant pain Intermittent left lower quadrant pain with history of functional ovarian cyst.  No pain currently and normal gynecologic exam.  Follow-up pelvic ultrasound to evaluate the ovaries. - US Transvaginal Non-OB; Future  [name] MD, 12:09 PM 05/13/2020

## 2020-05-14 LAB — PROLACTIN: Prolactin: 8.9 ng/mL

## 2020-05-14 LAB — TSH: TSH: 0.64 mIU/L

## 2020-05-14 LAB — FOLLICLE STIMULATING HORMONE: FSH: 58.1 m[IU]/mL

## 2020-06-11 ENCOUNTER — Encounter: Payer: Self-pay | Admitting: Obstetrics & Gynecology

## 2020-06-11 ENCOUNTER — Other Ambulatory Visit: Payer: Self-pay

## 2020-06-11 ENCOUNTER — Ambulatory Visit (INDEPENDENT_AMBULATORY_CARE_PROVIDER_SITE_OTHER): Payer: Self-pay | Admitting: Obstetrics & Gynecology

## 2020-06-11 ENCOUNTER — Ambulatory Visit (INDEPENDENT_AMBULATORY_CARE_PROVIDER_SITE_OTHER): Payer: Self-pay

## 2020-06-11 VITALS — BP 128/84

## 2020-06-11 DIAGNOSIS — N914 Secondary oligomenorrhea: Secondary | ICD-10-CM

## 2020-06-11 DIAGNOSIS — R1032 Left lower quadrant pain: Secondary | ICD-10-CM

## 2020-06-11 DIAGNOSIS — N951 Menopausal and female climacteric states: Secondary | ICD-10-CM

## 2020-06-11 NOTE — Progress Notes (Signed)
    Angela Ritter [birth year] [id number]        50 y.o.  G3P3L3 married  RP: Oligomenorrhea for pelvic ultrasound.  HPI: On visit May 13, 2020 we noted: Irregular menstrual periods spacing to 2-3 months x >1 year.  LMP 03/12/2020.  Intermittent hot flushes and insomnia x about 1 year.  H/O ovulatory ovarian cysts. No h/o gyn surgery.  Intermittent LLQ pain, no current pain.  No pain with IC.  Husband using condoms.     OB History  Gravida Para Term Preterm AB Living  3 3 3     3   SAB IAB Ectopic Multiple Live Births               # Outcome Date GA Lbr Len/2nd Weight Sex Delivery Anes PTL Lv  3 Term           2 Term           1 Term             Past medical history,surgical history, problem list, medications, allergies, family history and social history were all reviewed and documented in the EPIC chart.   Directed ROS with pertinent positives and negatives documented in the history of present illness/assessment and plan.  Exam:  Vitals:   06/11/20 1025  BP: 128/84   General appearance:  Normal  Pelvic US today: T/V images.  Anteverted uterus normal in size and shape.  A subserosal fibroid measured at 1.5 cm and an intramural fibroid measured at 1.3 cm are visualized.  The endometrial lining is trilayered with no mass or thickening seen.  It is measured at 10.05 mm.  Right ovary with a 2.0 cm follicle.  Left ovary with a simple thin-walled cyst which is avascular and echo-free with thin walls measured at 4.1 x 3.1 cm.  No adnexal mass otherwise.  No free fluid in the posterior cul-de-sac.  Labs 05/13/2020: FSH 58.1 TSH and Prolactin normal   Assessment/Plan:  50 y.o. G3P3003   1. Secondary oligomenorrhea Secondary oligomenorrhea associated with perimenopause.  Pelvic ultrasound findings thoroughly reviewed with patient.  Patient reassured that the endometrial lining is trilayered and normal.  Very small subserosal and intramural fibroids which are not concerning.  Both  ovaries are normal with a simple cyst on the left ovary and a follicle on the right ovary.  No free fluid in the pelvis.  Patient reassured about the findings.  TSH and prolactin normal.  FSH was in the menopausal range at 58.1 on May 13, 2020.  Patient informed that the [facility] may be variable in perimenopause.  Prefers observation of her progression into menopause.  Abnormal bleeding precautions reviewed.  2. Perimenopause Will observe progression towards menopause.  Bleeding precautions reviewed.  [name] MD, 10:33 AM 06/11/2020

## 2020-12-16 ENCOUNTER — Ambulatory Visit: Payer: Self-pay | Admitting: Obstetrics & Gynecology

## 2021-02-03 ENCOUNTER — Other Ambulatory Visit: Payer: Self-pay

## 2021-02-03 ENCOUNTER — Ambulatory Visit (INDEPENDENT_AMBULATORY_CARE_PROVIDER_SITE_OTHER): Payer: Self-pay | Admitting: Obstetrics & Gynecology

## 2021-02-03 ENCOUNTER — Encounter: Payer: Self-pay | Admitting: Obstetrics & Gynecology

## 2021-02-03 ENCOUNTER — Other Ambulatory Visit (HOSPITAL_COMMUNITY)
Admission: RE | Admit: 2021-02-03 | Discharge: 2021-02-03 | Disposition: A | Discharge status: Home / Self Care | Payer: No Typology Code available for payment source | Source: Ambulatory Visit | Attending: Obstetrics & Gynecology | Admitting: Obstetrics & Gynecology

## 2021-02-03 VITALS — BP 110/64 | HR 80 | Resp 16 | Ht 63.5 in | Wt 154.0 lb

## 2021-02-03 DIAGNOSIS — Z01419 Encounter for gynecological examination (general) (routine) without abnormal findings: Secondary | ICD-10-CM | POA: Insufficient documentation

## 2021-02-03 DIAGNOSIS — N951 Menopausal and female climacteric states: Secondary | ICD-10-CM

## 2021-02-03 NOTE — Progress Notes (Signed)
Graciana Morales-Chavez Oct 30, 1970 [id number]   History:    51 y.o. G3P3L3  Married  RP:  Established patient presenting for annual gyn exam   HPI:  Perimenopausal with Oligomenorrhea in 2022 occasionally spacing to every 2-3 months and occasionally having hot flushes and insomnia.  Since last visit in 06/2020, menses pretty regular every month with normal flow, no BTB.  No vasomotor menopausal Sx.  No pelvic pain.  No pain with IC. Using condoms. Last pap neg in 2017.  Pap reflex today.  Breasts normal.  Last mammo neg in 2016.  Will schedule now.  Will schedule [place].  BMI 26.85. Walking. Health labs with Fam MD. Past medical history,surgical history, family history and social history were all reviewed and documented in the EPIC chart.  Gynecologic History Patient's last menstrual period was 12/23/2020 (exact date).  Obstetric History OB History  Gravida Para Term Preterm AB Living  3 3 3     3   SAB IAB Ectopic Multiple Live Births               # Outcome Date GA Lbr Len/2nd Weight Sex Delivery Anes PTL Lv  3 Term           2 Term           1 Term              ROS: A ROS was performed and pertinent positives and negatives are included in the history.  GENERAL: No fevers or chills. HEENT: No change in vision, no earache, sore throat or sinus congestion. NECK: No pain or stiffness. CARDIOVASCULAR: No chest pain or pressure. No palpitations. PULMONARY: No shortness of breath, cough or wheeze. GASTROINTESTINAL: No abdominal pain, nausea, vomiting or diarrhea, melena or bright red blood per rectum. GENITOURINARY: No urinary frequency, urgency, hesitancy or dysuria. MUSCULOSKELETAL: No joint or muscle pain, no back pain, no recent trauma. DERMATOLOGIC: No rash, no itching, no lesions. ENDOCRINE: No polyuria, polydipsia, no heat or cold intolerance. No recent change in weight. HEMATOLOGICAL: No anemia or easy bruising or bleeding. NEUROLOGIC: No headache, seizures, numbness, tingling or  weakness. PSYCHIATRIC: No depression, no loss of interest in normal activity or change in sleep pattern.     Exam:   BP 110/64    Pulse 80    Resp 16    Ht 5' 3.5" (1.613 m)    Wt 154 lb (69.9 kg)    LMP 12/23/2020 (Exact Date)    BMI 26.85 kg/m   Body mass index is 26.85 kg/m.  General appearance : Well developed well nourished female. No acute distress HEENT: Eyes: no retinal hemorrhage or exudates,  Neck supple, trachea midline, no carotid bruits, no thyroidmegaly Lungs: Clear to auscultation, no rhonchi or wheezes, or rib retractions  Heart: Regular rate and rhythm, no murmurs or gallops Breast:Examined in sitting and supine position were symmetrical in appearance, no palpable masses or tenderness,  no skin retraction, no nipple inversion, no nipple discharge, no skin discoloration, no axillary or supraclavicular lymphadenopathy Abdomen: no palpable masses or tenderness, no rebound or guarding Extremities: no edema or skin discoloration or tenderness  Pelvic: Vulva: Normal             Vagina: No gross lesions or discharge  Cervix: No gross lesions or discharge.  Pap reflex done.  Uterus  AV, normal size, shape and consistency, non-tender and mobile  Adnexa  Without masses or tenderness  Anus: Normal  Pelvic US 06/2020: T/V images.  Anteverted uterus normal in size and shape.  A subserosal fibroid measured at 1.5 cm and an intramural fibroid measured at 1.3 cm are visualized.  The endometrial lining is trilayered with no mass or thickening seen.  It is measured at 10.05 mm.  Right ovary with a 2.0 cm follicle.  Left ovary with a simple thin-walled cyst which is avascular and echo-free with thin walls measured at 4.1 x 3.1 cm.  No adnexal mass otherwise.  No free fluid in the posterior cul-de-sac.   Labs 05/13/2020: FSH 58.1 TSH and Prolactin normal  Assessment/Plan:  51 y.o. female for annual exam   1. Encounter for routine gynecological examination with Papanicolaou smear of  cervix Perimenopausal with Oligomenorrhea in 2022 occasionally spacing to every 2-3 months and occasionally having hot flushes and insomnia.  Since last visit in 06/2020, menses pretty regular every month with normal flow, no BTB.  No vasomotor menopausal Sx.  No pelvic pain.  No pain with IC. Using condoms. Last pap neg in 2017.  Pap reflex today.  Breasts normal.  Last mammo neg in 2016.  Will schedule now.  Will schedule [facility].  BMI 26.85. Walking. Health labs with Fam MD. Past medical history,surgical history, family history and social history were all reviewed and documented in the EPIC chart. - Cytology - PAP( [place])  2. Perimenopause Perimenopausal with Oligomenorrhea in 2022 occasionally spacing to every 2-3 months and occasionally having hot flushes and insomnia.  Since last visit in 06/2020, menses pretty regular every month with normal flow, no BTB.  No vasomotor menopausal Sx.  No pelvic pain.  No pain with IC. Using condoms.  Will observe at this time.  Other orders - metFORMIN (GLUCOPHAGE) 850 MG tablet; Take 850 mg by mouth 3 (three) times daily. - pioglitazone (ACTOS) 15 MG tablet; Take by mouth. - simvastatin (ZOCOR) 10 MG tablet; SMARTSIG:1 Tablet(s) By Mouth Every Evening - omega-3 acid ethyl esters (LOVAZA) 1 g capsule; Take by mouth 2 (two) times daily. - MAGNESIUM PO; Take by mouth.   [name] MD, 12:15 PM 02/03/2021

## 2021-02-08 LAB — CYTOLOGY - PAP: Diagnosis: NEGATIVE

## 2021-05-26 ENCOUNTER — Encounter (HOSPITAL_COMMUNITY): Payer: Self-pay | Admitting: Emergency Medicine

## 2021-05-26 ENCOUNTER — Emergency Department (HOSPITAL_COMMUNITY): Payer: No Typology Code available for payment source

## 2021-05-26 ENCOUNTER — Other Ambulatory Visit: Payer: Self-pay

## 2021-05-26 ENCOUNTER — Emergency Department (HOSPITAL_COMMUNITY)
Admission: EM | Admit: 2021-05-26 | Discharge: 2021-05-26 | Disposition: A | Discharge status: Home / Self Care | Payer: No Typology Code available for payment source | Attending: Emergency Medicine | Admitting: Emergency Medicine

## 2021-05-26 DIAGNOSIS — Z7984 Long term (current) use of oral hypoglycemic drugs: Secondary | ICD-10-CM | POA: Insufficient documentation

## 2021-05-26 DIAGNOSIS — Z79899 Other long term (current) drug therapy: Secondary | ICD-10-CM | POA: Insufficient documentation

## 2021-05-26 DIAGNOSIS — D696 Thrombocytopenia, unspecified: Secondary | ICD-10-CM | POA: Insufficient documentation

## 2021-05-26 DIAGNOSIS — M791 Myalgia, unspecified site: Secondary | ICD-10-CM | POA: Insufficient documentation

## 2021-05-26 DIAGNOSIS — E119 Type 2 diabetes mellitus without complications: Secondary | ICD-10-CM | POA: Insufficient documentation

## 2021-05-26 DIAGNOSIS — R110 Nausea: Secondary | ICD-10-CM | POA: Insufficient documentation

## 2021-05-26 DIAGNOSIS — M7918 Myalgia, other site: Secondary | ICD-10-CM

## 2021-05-26 DIAGNOSIS — R1011 Right upper quadrant pain: Secondary | ICD-10-CM | POA: Insufficient documentation

## 2021-05-26 LAB — CBC WITH DIFFERENTIAL/PLATELET
Abs Immature Granulocytes: 0.01 10*3/uL (ref 0.00–0.07)
Basophils Absolute: 0 10*3/uL (ref 0.0–0.1)
Basophils Relative: 0 %
Eosinophils Absolute: 0.1 10*3/uL (ref 0.0–0.5)
Eosinophils Relative: 2 %
HCT: 44.1 % (ref 36.0–46.0)
Hemoglobin: 14.5 g/dL (ref 12.0–15.0)
Immature Granulocytes: 0 %
Lymphocytes Relative: 44 %
Lymphs Abs: 2 10*3/uL (ref 0.7–4.0)
MCH: 27.7 pg (ref 26.0–34.0)
MCHC: 32.9 g/dL (ref 30.0–36.0)
MCV: 84.3 fL (ref 80.0–100.0)
Monocytes Absolute: 0.4 10*3/uL (ref 0.1–1.0)
Monocytes Relative: 9 %
Neutro Abs: 2.1 10*3/uL (ref 1.7–7.7)
Neutrophils Relative %: 45 %
Platelets: 24 10*3/uL — CL (ref 150–400)
RBC: 5.23 MIL/uL — ABNORMAL HIGH (ref 3.87–5.11)
RDW: 12.8 % (ref 11.5–15.5)
Smear Review: DECREASED
WBC: 4.7 10*3/uL (ref 4.0–10.5)
nRBC: 0 % (ref 0.0–0.2)

## 2021-05-26 LAB — COMPREHENSIVE METABOLIC PANEL
ALT: 24 U/L (ref 0–44)
AST: 20 U/L (ref 15–41)
Albumin: 4.4 g/dL (ref 3.5–5.0)
Alkaline Phosphatase: 55 U/L (ref 38–126)
Anion gap: 8 (ref 5–15)
BUN: 12 mg/dL (ref 6–20)
CO2: 26 mmol/L (ref 22–32)
Calcium: 9.6 mg/dL (ref 8.9–10.3)
Chloride: 99 mmol/L (ref 98–111)
Creatinine, Ser: 0.43 mg/dL — ABNORMAL LOW (ref 0.44–1.00)
GFR, Estimated: >60 mL/min (ref >60)
Glucose, Bld: 255 mg/dL — ABNORMAL HIGH (ref 70–99)
Potassium: 4.2 mmol/L (ref 3.5–5.1)
Sodium: 133 mmol/L — ABNORMAL LOW (ref 135–145)
Total Bilirubin: 0.8 mg/dL (ref 0.3–1.2)
Total Protein: 8.3 g/dL — ABNORMAL HIGH (ref 6.5–8.1)

## 2021-05-26 LAB — URINALYSIS, ROUTINE W REFLEX MICROSCOPIC
Bacteria, UA: NONE SEEN
Bilirubin Urine: NEGATIVE
Glucose, UA: >=500 mg/dL — AB
Hgb urine dipstick: NEGATIVE
Ketones, ur: 5 mg/dL — AB
Leukocytes,Ua: NEGATIVE
Nitrite: NEGATIVE
Protein, ur: NEGATIVE mg/dL
Specific Gravity, Urine: 1.002 — ABNORMAL LOW (ref 1.005–1.030)
pH: 6 (ref 5.0–8.0)

## 2021-05-26 LAB — SAVE SMEAR(SSMR), FOR PROVIDER SLIDE REVIEW

## 2021-05-26 LAB — I-STAT BETA HCG BLOOD, ED (MC, WL, AP ONLY): I-stat hCG, quantitative: <5 m[IU]/mL (ref <5)

## 2021-05-26 LAB — LIPASE, BLOOD: Lipase: 29 U/L (ref 11–51)

## 2021-05-26 MED ORDER — DEXAMETHASONE 20 MG PO TABS: 40.0000 mg | ORAL_TABLET | Freq: Every day | ORAL | 0 refills | Status: DC

## 2021-05-26 MED ORDER — METHOCARBAMOL 500 MG PO TABS: 500.0000 mg | ORAL_TABLET | Freq: Two times a day (BID) | ORAL | 0 refills | Status: DC

## 2021-05-26 MED ORDER — SODIUM CHLORIDE 0.9 % IV BOLUS
1000.0000 mL | Freq: Once | INTRAVENOUS | Status: AC
  Administered 2021-05-26: 1000 mL via INTRAVENOUS

## 2021-05-26 MED ORDER — FENTANYL CITRATE PF 50 MCG/ML IJ SOSY
50.0000 ug | PREFILLED_SYRINGE | Freq: Once | INTRAMUSCULAR | Status: AC
  Administered 2021-05-26: 50 ug via INTRAVENOUS
  Filled 2021-05-26: qty 1

## 2021-05-26 MED ORDER — DEXAMETHASONE 20 MG PO TABS: 40.0000 mg | ORAL_TABLET | Freq: Every day | ORAL | 0 refills | Status: AC

## 2021-05-26 MED ORDER — IOHEXOL 300 MG/ML  SOLN
100.0000 mL | Freq: Once | INTRAMUSCULAR | Status: AC | PRN
  Administered 2021-05-26: 100 mL via INTRAVENOUS

## 2021-05-26 MED ORDER — PANTOPRAZOLE SODIUM 20 MG PO TBEC: 40.0000 mg | DELAYED_RELEASE_TABLET | Freq: Every day | ORAL | 0 refills | Status: DC

## 2021-05-26 NOTE — ED Triage Notes (Signed)
Pt states she is having RUQ pain for 7 days. States this pain goes to her back. Pt reports some nausea when eating. Denies V/D. ?

## 2021-05-26 NOTE — ED Notes (Signed)
Discharge instructions reviewed, questions answered. Rx education provided. Pt states understanding and no further questions. Pt ambulatory with steady gait upon discharge. No s/s of distress noted. ? ?

## 2021-05-26 NOTE — ED Provider Triage Note (Signed)
Emergency Medicine Provider Triage Evaluation Note  Angela Ritter , a 51 y.o. female  was evaluated in triage.  Pt complains of intermittent RUQ pain. Worse in morning and with eating. Radiates into upper/ mid back. Similar episode 2 weeks ago. Nausea with eating some intermittent loose stool. No fever, CP, COB, urinary sx. Denies chance of preg. No hx of pancreatitis  Review of Systems  Positive: Ruq pain, nausea Negative: Fever, co, sob  Physical Exam  BP 135/80 (BP Location: Left Arm)   Pulse 83   Temp 98.1 F (36.7 C) (Oral)   Resp 18   Ht 5' 3.5" (1.613 m)   Wt 70.8 kg   SpO2 98%   BMI 27.20 kg/m  Gen:   Awake, no distress   Resp:  Normal effort  MSK:   Moves extremities without difficulty  ABD:  Soft tenderness to epigastric, RUQ, mid right abd Other:    Medical Decision Making  Medically screening exam initiated at 2:58 PM.  Appropriate orders placed.  Angela Ritter was informed that the remainder of the evaluation will be completed by another provider, this initial triage assessment does not replace that evaluation, and the importance of remaining in the ED until their evaluation is complete.  Abd pain   Angela Ritter A, PA-C 05/26/21 1459

## 2021-05-26 NOTE — Progress Notes (Signed)
Brief Hematology/Oncology Plan of Care Note ? ?Clinical Summary: Angela Ritter is a 51 year old female who presented to the ED with abdominal pain and was found to have Plt 24 with no other hematological abnormalities.  ? ?Pertinent History: ?-- Labs show white blood cell count 4.7, hemoglobin 14.5, MCV 84.3, and platelets of 24.  Additionally patient had normal creatinine, and liver function ?--Per records no issues with bleeding or bruising ?--Abdominal US and CT abdomen pelvis did not show clear etiology for her abdominal pain ? ?O: ? ?Vitals:  ? 05/26/21 2132 05/26/21 2200  ?BP: (!) 111/55 97/63  ?Pulse: 73 64  ?Resp: 16 16  ?Temp:    ?SpO2: 98% 95%  ? ? ?  Latest Ref Rng & Units 05/26/2021  ?  3:39 PM 07/17/2008  ?  9:20 PM 04/25/2007  ?  7:59 PM  ?CMP  ?Glucose 70 - 99 mg/dL 255   137   79    ?BUN 6 - 20 mg/dL '12   8   14    '$ ?Creatinine 0.44 - 1.00 mg/dL 0.43   0.59   0.62    ?Sodium 135 - 145 mmol/L 133   132   140    ?Potassium 3.5 - 5.1 mmol/L 4.2   3.0   3.9    ?Chloride 98 - 111 mmol/L 99   99   102    ?CO2 22 - 32 mmol/L '26   24   25    '$ ?Calcium 8.9 - 10.3 mg/dL 9.6   8.9   9.2    ?Total Protein 6.5 - 8.1 g/dL 8.3   7.9   8.2    ?Total Bilirubin 0.3 - 1.2 mg/dL 0.8   1.1   1.0    ?Alkaline Phos 38 - 126 U/L 55   54   51    ?AST 15 - 41 U/L '20   23   18    '$ ?ALT 0 - 44 U/L '24   25   17    '$ ? ? ?  Latest Ref Rng & Units 05/26/2021  ?  3:39 PM 07/17/2008  ?  9:20 PM 04/25/2007  ?  7:59 PM  ?CBC  ?WBC 4.0 - 10.5 K/uL 4.7   12.7   6.0    ?Hemoglobin 12.0 - 15.0 g/dL 14.5   11.5   11.5    ?Hematocrit 36.0 - 46.0 % 44.1   33.6   35.1    ?Platelets 150 - 400 K/uL 24   272   305    ?  ? ?Assessment/Plan: ? ?#Isolated Severe Thrombocytopenia ?-- Patient has normal hemoglobin and no other abnormalities noted in the blood.  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  No need to pursue that work-up at this time ?--Recommend starting 40 mg  dexamethasone p.o. daily x4 days ?--No indication for platelet transfusion at this time ?--If patient is admitted overnight for hematology consult in the a.m.  If discharged we will schedule close outpatient follow-up ? ?Please note this is a plan of care note for overnight guidance and the patient was not examined.  Full consult will follow tomorrow. ? ? ?Ledell Peoples, MD ?Department of Hematology/Oncology ?[place] [facility] ?Phone: [phone] ?Pager: [phone] ?Email: [email or web address] ? ?

## 2021-05-26 NOTE — ED Provider Notes (Signed)
[facility] DEPT Provider Note   CSN: [id number] Arrival date & time: 05/26/21  1251     History  Chief Complaint  Patient presents with   Abdominal Pain    Angela Ritter is a 51 y.o. female.  51 year old female with complaint of intermittent RUQ pain. Pain worse in the morning and associated with eating. Nausea, no vomiting. She had a similar episode two weeks ago. Denies fever, chest pain, shortness of breath, urinary symptoms. History of diabetes.  The history is provided by the patient. The history is limited by a language barrier. A language interpreter was used.  Abdominal Pain Pain location:  RUQ Pain quality: gnawing and throbbing   Pain radiates to:  R shoulder Pain severity:  Moderate Onset quality:  Gradual Duration:  2 weeks Timing:  Intermittent Progression:  Waxing and waning Chronicity:  Recurrent Context: not alcohol use and not sick contacts   Associated symptoms: nausea   Associated symptoms: no chest pain, no cough, no dysuria, no fever, no shortness of breath and no vomiting       Home Medications Prior to Admission medications   Medication Sig Start Date End Date Taking? Authorizing Provider  folic acid (FOLVITE) 1 MG tablet Take 1 mg by mouth daily. Reported on 04/23/2015    [provider]  IRON PO Take by mouth 2 (two) times daily. Reported on 04/23/2015    [provider]  lisinopril-hydrochlorothiazide (PRINZIDE,ZESTORETIC) 20-12.5 MG tablet Take 1 tablet by mouth daily. 02/25/16   Nicolette Bang, MD  MAGNESIUM PO Take by mouth.    [provider]  metFORMIN (GLUCOPHAGE) 850 MG tablet Take 850 mg by mouth 3 (three) times daily. 12/17/20   [provider]  Multiple Vitamins-Iron (MULTIVITAMINS WITH IRON) TABS Take 1 tablet by mouth daily. Reported on 04/23/2015    [provider]  omega-3 acid ethyl esters (LOVAZA) 1 g capsule Take by mouth 2 (two) times daily.     [provider]  pioglitazone (ACTOS) 15 MG tablet Take by mouth. 01/13/21   [provider]  simvastatin (ZOCOR) 10 MG tablet SMARTSIG:1 Tablet(s) By Mouth Every Evening 01/18/21   [provider]      Allergies    Patient has no known allergies.    Review of Systems   Review of Systems  Constitutional:  Negative for fever.  Respiratory:  Negative for cough and shortness of breath.   Cardiovascular:  Negative for chest pain.  Gastrointestinal:  Positive for abdominal pain and nausea. Negative for vomiting.  Genitourinary:  Negative for dysuria.  All other systems reviewed and are negative.  Physical Exam Updated Vital Signs BP 113/72 (BP Location: Left Arm)   Pulse 65   Temp 98.1 F (36.7 C) (Oral)   Resp 18   Ht 5' 3.5" (1.613 m)   Wt 70.8 kg   SpO2 99%   BMI 27.20 kg/m  Physical Exam  ED Results / Procedures / Treatments   Labs (all labs ordered are listed, but only abnormal results are displayed) Labs Reviewed  URINALYSIS, ROUTINE W REFLEX MICROSCOPIC - Abnormal; Notable for the following components:      Result Value   Color, Urine COLORLESS (*)    Specific Gravity, Urine 1.002 (*)    Glucose, UA >=500 (*)    Ketones, ur 5 (*)    All other components within normal limits  CBC WITH DIFFERENTIAL/PLATELET - Abnormal; Notable for the following components:   RBC 5.23 (*)  Platelets 24 (*)    All other components within normal limits  COMPREHENSIVE METABOLIC PANEL - Abnormal; Notable for the following components:   Sodium 133 (*)    Glucose, Bld 255 (*)    Creatinine, Ser 0.43 (*)    Total Protein 8.3 (*)    All other components within normal limits  LIPASE, BLOOD  I-STAT BETA HCG BLOOD, ED ([facility], [facility], [facility] ONLY)    EKG None  Radiology CT ABDOMEN PELVIS W CONTRAST  Result Date: 05/26/2021 CLINICAL DATA:  Abdominal pain. EXAM: CT ABDOMEN AND PELVIS WITH CONTRAST TECHNIQUE: Multidetector CT imaging of the abdomen and pelvis was  performed using the standard protocol following bolus administration of intravenous contrast. RADIATION DOSE REDUCTION: This exam was performed according to the departmental dose-optimization program which includes automated exposure control, adjustment of the mA and/or kV according to patient size and/or use of iterative reconstruction technique. CONTRAST:  158m OMNIPAQUE IOHEXOL 300 MG/ML  SOLN COMPARISON:  Right upper quadrant ultrasound dated 05/26/2021. FINDINGS: Lower chest: The visualized lung bases are clear. No intra-abdominal free air or free fluid. Hepatobiliary: The liver is unremarkable. Mild biliary ductal dilatation, likely post cholecystectomy. No retained calcified stone noted in the central CBD. Pancreas: Unremarkable. No pancreatic ductal dilatation or surrounding inflammatory changes. Spleen: Normal in size without focal abnormality. Adrenals/Urinary Tract: The adrenal glands are unremarkable. There is no hydronephrosis on either side. There is symmetric enhancement and excretion of contrast by both kidneys. The visualized ureters and urinary bladder appear unremarkable. Stomach/Bowel: There is no bowel obstruction or active inflammation. The appendix is normal. Vascular/Lymphatic: Mild aortoiliac atherosclerotic disease. The IVC is unremarkable. No portal venous gas. There is no adenopathy. Reproductive: The uterus is anteverted. The endometrium is mildly thickened measuring 9 mm. Ultrasound may provide better evaluation of the pelvic structures. Bilateral ovarian cysts measure up to 4 cm on the left. Recommend follow-up [facility] 6-12 months. Note: This recommendation does not apply to premenarchal patients and to those with increased risk (genetic, family history, elevated tumor markers or other high-risk factors) of ovarian cancer. Reference: JACR 2020 Feb; 17(2):248-254 Other: Small fat containing umbilical hernia. Mild thickened appearance of the herniated fat. Clinical correlation is  recommended to exclude strangulation/incarceration. Musculoskeletal: No acute or significant osseous findings. IMPRESSION: 1. No bowel obstruction. Normal appendix. 2. A 4 cm left ovarian cyst. Recommend follow-up [facility] 6-12 months. 3. Aortic Atherosclerosis (ICD10-I70.0). Electronically Signed   By: AAnner CreteM.D.   On: 05/26/2021 19:53   UKoreaAbdomen Limited RUQ (LIVER/GB)  Result Date: 05/26/2021 CLINICAL DATA:  Right upper quadrant pain EXAM: ULTRASOUND ABDOMEN LIMITED RIGHT UPPER QUADRANT COMPARISON:  None Available. FINDINGS: Gallbladder: Prior cholecystectomy Common bile duct: Diameter: 7 mm in diameter, slightly prominent likely related to post cholecystectomy state. Liver: Heterogeneous, mildly increased echotexture suggesting fatty infiltration. No focal hepatic abnormality. Portal vein is patent on color Doppler imaging with normal direction of blood flow towards the liver. Other: None. IMPRESSION: Prior cholecystectomy. Suspect early fatty infiltration of the liver. Electronically Signed   By: KRolm BaptiseM.D.   On: 05/26/2021 15:31    Procedures Procedures    Medications Ordered in ED Medications - No data to display  ED Course/ Medical Decision Making/ A&P                           Medical Decision Making Amount and/or Complexity of Data Reviewed Labs: ordered.   Patient with intermittent abdominal pain  for 2 weeks with nausea, radiating to right shoulder. No gallbladder. History of diabetes. Platelet count of 24 noted. No abnormal bleeding or bruising. No current medications associated with inducing thrombocytopenia. Consult with hematology ([place])--if discharged, start on dexamethasone 40 po for 4 days--will contact patient for office follow-up.   Scapular discomfort is reproducible with palpation and movement, likely MSK in origin. Will treat with muscle relaxant.  Patient is nontoxic, nonseptic appearing, in no apparent distress.  Patient's pain and other symptoms  adequately managed in emergency department.  Fluid bolus given.  Labs, imaging and vitals reviewed.  Patient does not meet the SIRS or Sepsis criteria.  On repeat exam patient does not have a surgical abdomen and there are no peritoneal signs.  No indication of appendicitis, bowel obstruction, bowel perforation, cholecystitis, diverticulitis, PID or ectopic pregnancy.  Patient discharged home with symptomatic treatment and given strict instructions for follow-up with their primary care physician.  I have also discussed reasons to return immediately to the ER.           Final Clinical Impression(s) / ED Diagnoses Final diagnoses:  Thrombocytopenia ([place])  Musculoskeletal pain  Right upper quadrant abdominal pain    Rx / DC Orders ED Discharge Orders     None         Etta Quill, NP 05/26/21 2255    Gareth Morgan, MD 05/27/21 1024

## 2021-05-26 NOTE — Discharge Instructions (Addendum)
Take medications as directed. Please refer to the attached instructions. Dr. Libby Maw office should contact you tomorrow to arrange follow-up. ?

## 2021-06-01 ENCOUNTER — Other Ambulatory Visit: Payer: Self-pay

## 2021-06-01 ENCOUNTER — Inpatient Hospital Stay: Payer: No Typology Code available for payment source

## 2021-06-01 ENCOUNTER — Other Ambulatory Visit: Payer: Self-pay | Admitting: Hematology and Oncology

## 2021-06-01 ENCOUNTER — Inpatient Hospital Stay: Payer: Self-pay | Attending: Hematology and Oncology | Admitting: Hematology and Oncology

## 2021-06-01 VITALS — BP 138/71 | HR 62 | Temp 97.9°F | Resp 20 | Wt 154.9 lb

## 2021-06-01 DIAGNOSIS — D696 Thrombocytopenia, unspecified: Secondary | ICD-10-CM | POA: Insufficient documentation

## 2021-06-01 DIAGNOSIS — Z79899 Other long term (current) drug therapy: Secondary | ICD-10-CM | POA: Insufficient documentation

## 2021-06-01 DIAGNOSIS — I10 Essential (primary) hypertension: Secondary | ICD-10-CM | POA: Insufficient documentation

## 2021-06-01 DIAGNOSIS — E119 Type 2 diabetes mellitus without complications: Secondary | ICD-10-CM | POA: Insufficient documentation

## 2021-06-01 DIAGNOSIS — Z7984 Long term (current) use of oral hypoglycemic drugs: Secondary | ICD-10-CM | POA: Insufficient documentation

## 2021-06-01 LAB — CBC WITH DIFFERENTIAL (CANCER CENTER ONLY)
Abs Immature Granulocytes: 0.01 10*3/uL (ref 0.00–0.07)
Basophils Absolute: 0 10*3/uL (ref 0.0–0.1)
Basophils Relative: 0 %
Eosinophils Absolute: 0 10*3/uL (ref 0.0–0.5)
Eosinophils Relative: 1 %
HCT: 41.1 % (ref 36.0–46.0)
Hemoglobin: 14 g/dL (ref 12.0–15.0)
Immature Granulocytes: 0 %
Lymphocytes Relative: 27 %
Lymphs Abs: 1.6 10*3/uL (ref 0.7–4.0)
MCH: 27.8 pg (ref 26.0–34.0)
MCHC: 34.1 g/dL (ref 30.0–36.0)
MCV: 81.5 fL (ref 80.0–100.0)
Monocytes Absolute: 0.5 10*3/uL (ref 0.1–1.0)
Monocytes Relative: 8 %
Neutro Abs: 3.9 10*3/uL (ref 1.7–7.7)
Neutrophils Relative %: 64 %
Platelet Count: 311 10*3/uL (ref 150–400)
RBC: 5.04 MIL/uL (ref 3.87–5.11)
RDW: 12.2 % (ref 11.5–15.5)
WBC Count: 6 10*3/uL (ref 4.0–10.5)
nRBC: 0 % (ref 0.0–0.2)

## 2021-06-01 LAB — CMP (CANCER CENTER ONLY)
ALT: 13 U/L (ref 0–44)
AST: 10 U/L — ABNORMAL LOW (ref 15–41)
Albumin: 4.2 g/dL (ref 3.5–5.0)
Alkaline Phosphatase: 52 U/L (ref 38–126)
Anion gap: 7 (ref 5–15)
BUN: 13 mg/dL (ref 6–20)
CO2: 32 mmol/L (ref 22–32)
Calcium: 9.2 mg/dL (ref 8.9–10.3)
Chloride: 93 mmol/L — ABNORMAL LOW (ref 98–111)
Creatinine: 0.67 mg/dL (ref 0.44–1.00)
GFR, Estimated: >60 mL/min (ref >60)
Glucose, Bld: 320 mg/dL — ABNORMAL HIGH (ref 70–99)
Potassium: 3.8 mmol/L (ref 3.5–5.1)
Sodium: 132 mmol/L — ABNORMAL LOW (ref 135–145)
Total Bilirubin: 0.9 mg/dL (ref 0.3–1.2)
Total Protein: 7.6 g/dL (ref 6.5–8.1)

## 2021-06-01 LAB — SAVE SMEAR(SSMR), FOR PROVIDER SLIDE REVIEW

## 2021-06-01 LAB — FOLATE: Folate: 18.9 ng/mL (ref >5.9)

## 2021-06-01 LAB — IMMATURE PLATELET FRACTION: Immature Platelet Fraction: 5.9 % (ref 1.2–8.6)

## 2021-06-01 LAB — VITAMIN B12: Vitamin B-12: 1017 pg/mL — ABNORMAL HIGH (ref 180–914)

## 2021-06-01 NOTE — Patient Instructions (Signed)
  Clayburn Pert por [place] para brindarle atencin. Si tiene preguntas despus de su [facility]), comunquese con esta oficina al [phone] Lyndal Pulley las 8:30 a. m. y las 4:30 [place] que los mensajes de voz que se dejen despus de las 4:00 p. m. no se devuelvan hasta el siguiente da hbil. Las llamadas recibidas despus de las 4:30 p. m. sern atendidas por una lnea de triaje de enfermera fuera del sitio.    Resurtidos de medicamentos recetados: Solicite a Engineer, production que se comunique con nosotros directamente para la mayora de las solicitudes de medicamentos recetados. Comunquese directamente con la oficina para recargar narcticos (medicamentos para Conservation officer, historic buildings). Permita 76-28 horas para recargas.  Citas: Comunquese con el departamento de programacin de [facility] al [phone] si tiene preguntas sobre la programacin de citas de [place]. Pngase en contacto con los programadores con cualquier cambio de programacin para que su cita se pueda reprogramar de manera oportuna.  Programacin central para [place] [phone]: llame para programar procedimientos como tomografas PET, tomografas computarizadas, resonancias magnticas, ultrasonido, etc.  Para permitirle a cada paciente un tiempo de calidad con nuestros proveedores, llegue 30 minutos antes de la hora programada para su cita. Si llega tarde a su cita, es posible que se le pida que la reprograme. Nos esforzamos por brindarle tiempo de calidad con nuestros proveedores, y Sports administrator tarde lo afecta a usted y a otros pacientes cuyas citas son posteriores a Merchandiser, retail. Si no se presenta a varias visitas programadas, es posible que lo despidan de la clnica a discrecin del proveedor.  Recursos: Luellen Pucker de [place] [phone] para obtener informacin adicional sobre programas de asistencia o asistencia para conectarse con programas de apoyo comunitario DSS del Liliane Bade  [phone]: informacin sobre cupones de [place], [place] y asistencia con los servicios pblicos [employer] [phone] Servicio de transporte compartido de la Autoridad de [place] de [place] para pasajeros elegibles que tienen una discapacidad que les impide viajar en el autobs de ruta fija. Centro de derechos de [place] [phone] Ayuda a las personas con [place] a comprender sus derechos y beneficios, navegar por el sistema de Medicare y Therapist, music la atencin mdica de calidad que merecen Sociedad Estadounidense del Cncer [phone] Ayuda a los pacientes a Orthoptist varios tipos de apoyo y asistencia financiera Cancer Care: 1-800-813-HOPE [phone]) Brinda asistencia financiera, grupos de apoyo en lnea, asistencia con medicamentos/copagos. Asistencia de transporte para citas en [facility]: [phone]  Tasmine Collum por [place] para su atencin.  Arriba traducido con el servicio de traduccin electrnica y puede contener errores Ambulance person

## 2021-06-01 NOTE — Progress Notes (Signed)
[place] [phone]   Fax:(336) [place] NOTE  Patient Care Team: Caroline More, DO (Inactive) as PCP - General (Family Medicine)  Hematological/Oncological History # Thrombocytopenia, likely ITP 05/26/2021: presented to the ED with abdominal pain. CBC showed white blood cell count 4.7, hemoglobin 14.5, MCV 84.3, and platelets of 24. Started on PO dexamethasone 40 mg x 4 days. 06/01/2021: establish care with Dr. Lorenso Courier   CHIEF COMPLAINTS/PURPOSE OF CONSULTATION:  "Thrombocytopenia "  HISTORY OF PRESENTING ILLNESS:  Angela Ritter 51 y.o. female with medical history significant for DM type II, HTN, and anemia who presents for evaluation of severe thrombocytopenia.   On review of the previous records Angela Ritter presented to the emergency department on 05/26/2021 with abdominal pain. CBC drawn at the time showed white blood cell count 4.7, hemoglobin 14.5, MCV 84.3, and platelets of 24.  CT abdomen performed for the abdominal pain showed no evidence of liver disease or splenomegaly.  Due to concern for these findings hematology was consulted while the patient was in the emergency department.  A plan of care note was left with recommendations for starting dexamethasone 40 mg p.o. for 4 days.  Patient was discharged from the emergency department and presents today to establish care with Dr. Lorenso Courier.  On exam today Angela Ritter is accompanied by her husband and hospital approved interpreter.  She reports that she presented the emergency department last week after having a pain in the right side of her abdomen for 2 weeks time.  She notes that she had been lifting weights and felt the sharp pain on that side.  She notes that she had not been having any issues with bleeding or bruising.  Her menstrual cycles have been regular with no recent increase in the flow.  She notes that she has not recently started any new medications and has  had no recent infections.  She began taking the steroid pills on Saturday and today is her last day.  She reports she is not having any major side effects as result of this treatment.  On further discussion she notes that her family history is remarkable for low platelets in her maternal aunt and sciatica in her mother.  Her father is healthy and she has 3 healthy children.  She is a never smoker never drinker and currently works as a housewife.  She currently denies any issues with fevers, chills, sweats, nausea, vomiting or diarrhea.  Her pain has resolved.  Full 10 point ROS is listed below.  MEDICAL HISTORY:  Past Medical History:  Diagnosis Date   Anemia    DM2 (diabetes mellitus, type 2) (HCC)    Hypertension     SURGICAL HISTORY: Past Surgical History:  Procedure Laterality Date   CHOLECYSTECTOMY      SOCIAL HISTORY: Social History   Socioeconomic History   Marital status: Married    Spouse name: Not on file   Number of children: Not on file   Years of education: Not on file   Highest education level: Not on file  Occupational History   Not on file  Tobacco Use   Smoking status: Never   Smokeless tobacco: Never  Vaping Use   Vaping Use: Never used  Substance and Sexual Activity   Alcohol use: No   Drug use: No   Sexual activity: Yes    Partners: Male    Birth control/protection: Condom    Comment: 1st intercourse- 45, married- 29 yrs,  Other Topics Concern   Not on file  Social History Narrative   Level of education: middle school    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, house wife     Transportation: friend drives her    Exercise: walks every day for 30 minutes    Housing situation: rent a house    Relationships (safe): yes   Contact for message Health and safety inspector): daughter           Social Determinants of Health   Financial Resource Strain: Not on file  Food Insecurity: Not on file  Transportation Needs: Not on file  Physical Activity: Not on file  Stress: Not on file   Social Connections: Not on file  Intimate Partner Violence: Not on file    FAMILY HISTORY: Family History  Problem Relation Age of Onset   Diabetes Maternal Uncle    Diabetes Paternal Uncle    Cancer Paternal Grandmother        colon   Hypertension Mother    Cancer Sister        Breast Cancer at 32    ALLERGIES:  has No Known Allergies.  MEDICATIONS:  Current Outpatient Medications  Medication Sig Dispense Refill   folic acid (FOLVITE) 1 MG tablet Take 1 mg by mouth daily. Reported on 04/23/2015     lisinopril-hydrochlorothiazide (PRINZIDE,ZESTORETIC) 20-12.5 MG tablet Take 1 tablet by mouth daily. 30 tablet 1   metFORMIN (GLUCOPHAGE) 850 MG tablet Take 850 mg by mouth 3 (three) times daily.     methocarbamol (ROBAXIN) 500 MG tablet Take 1 tablet (500 mg total) by mouth 2 (two) times daily. 20 tablet 0   omega-3 acid ethyl esters (LOVAZA) 1 g capsule Take by mouth 2 (two) times daily.     IRON PO Take by mouth 2 (two) times daily. Reported on 04/23/2015 (Patient not taking: Reported on 06/01/2021)     Multiple Vitamins-Iron (MULTIVITAMINS WITH IRON) TABS Take 1 tablet by mouth daily. Reported on 04/23/2015 (Patient not taking: Reported on 06/01/2021)     pantoprazole (PROTONIX) 20 MG tablet Take 2 tablets (40 mg total) by mouth daily for 14 days. (Patient not taking: Reported on 06/01/2021) 28 tablet 0   pioglitazone (ACTOS) 15 MG tablet Take by mouth. (Patient not taking: Reported on 06/01/2021)     simvastatin (ZOCOR) 10 MG tablet SMARTSIG:1 Tablet(s) By Mouth Every Evening (Patient not taking: Reported on 06/01/2021)     No current facility-administered medications for this visit.    REVIEW OF SYSTEMS:   Constitutional: ( - ) fevers, ( - )  chills , ( - ) night sweats Eyes: ( - ) blurriness of vision, ( - ) double vision, ( - ) watery eyes Ears, nose, mouth, throat, and face: ( - ) mucositis, ( - ) sore throat Respiratory: ( - ) cough, ( - ) dyspnea, ( - ) wheezes Cardiovascular:  ( - ) palpitation, ( - ) chest discomfort, ( - ) lower extremity swelling Gastrointestinal:  ( - ) nausea, ( - ) heartburn, ( - ) change in bowel habits Skin: ( - ) abnormal skin rashes Lymphatics: ( - ) new lymphadenopathy, ( - ) easy bruising Neurological: ( - ) numbness, ( - ) tingling, ( - ) new weaknesses Behavioral/Psych: ( - ) mood change, ( - ) new changes  All other systems were reviewed with the patient and are negative.  PHYSICAL EXAMINATION:  Vitals:   06/01/21 0852  BP: 138/71  Pulse: 62  Resp: 20  Temp: 97.9 F (36.6  C)  SpO2: 100%   Filed Weights   06/01/21 0852  Weight: 154 lb 14.4 oz (70.3 kg)    GENERAL: well appearing middle-aged Hispanic female in NAD  SKIN: skin color, texture, turgor are normal, no rashes or significant lesions EYES: conjunctiva are pink and non-injected, sclera clear LUNGS: clear to auscultation and percussion with normal breathing effort HEART: regular rate & rhythm and no murmurs and no lower extremity edema Musculoskeletal: no cyanosis of digits and no clubbing  PSYCH: alert & oriented x 3, fluent speech NEURO: no focal motor/sensory deficits  LABORATORY DATA:  I have reviewed the data as listed    Latest Ref Rng & Units 06/01/2021    8:28 AM 05/26/2021    3:39 PM 07/17/2008    9:20 PM  CBC  WBC 4.0 - 10.5 K/uL 6.0   4.7   12.7    Hemoglobin 12.0 - 15.0 g/dL 14.0   14.5   11.5    Hematocrit 36.0 - 46.0 % 41.1   44.1   33.6    Platelets 150 - 400 K/uL 311   24   272         Latest Ref Rng & Units 06/01/2021    8:28 AM 05/26/2021    3:39 PM 07/17/2008    9:20 PM  CMP  Glucose 70 - 99 mg/dL 320   255   137    BUN 6 - 20 mg/dL _0 Creatinine 0.44 - 1.00 mg/dL 0.67   0.43   0.59    Sodium 135 - 145 mmol/L 132   133   132    Potassium 3.5 - 5.1 mmol/L 3.8   4.2   3.0    Chloride 98 - 111 mmol/L 93   99   99    CO2 22 - 32 mmol/L 32   26   24    Calcium 8.9 - 10.3 mg/dL 9.2   9.6   8.9    Total Protein 6.5 - 8.1 g/dL  7.6   8.3   7.9    Total Bilirubin 0.3 - 1.2 mg/dL 0.9   0.8   1.1    Alkaline Phos 38 - 126 U/L 52   55   54    AST 15 - 41 U/L _1 ALT 0 - 44 U/L _2 ASSESSMENT & PLAN Kairi Strine 51 y.o. female with medical history significant for DM type II, HTN, and anemia who presents for evaluation of severe thrombocytopenia.   After review of the labs, review of the records, and discussion with the patient the patients findings are most consistent with ITP.  There is no specific diagnostic test for ITP, however response to steroids and her clinical picture are most consistent with that of ITP.  The inciting etiology is unclear at this time, would recommend after discontinuation of steroid therapy to monitor platelets once weekly.  The patient voiced understanding of the plan moving forward.  #Isolated Severe Thrombocytopenia # Immune Thrombocytopenic Purpura (ITP) -- Patient had normal hemoglobin and no other abnormalities noted in the blood.  With isolated thrombocytopenia the most likely etiology is ITP --With normal hemoglobin markedly low suspicion for TMA.  No need to pursue that work-up at this time --patient completed 40 mg dexamethasone p.o. daily x4 days with robust rebound to Plt 311  today.  --No indication for a bone marrow biopsy.  --Recommend labs weekly x next 4 weeks followed by a clinic visit in 8 weeks time.   Orders Placed This Encounter  Procedures   CBC with Differential ([facility] Only)    Standing Status:   Standing    Number of Occurrences:   52    Standing Expiration Date:   06/02/2022    All questions were answered. The patient knows to call the clinic with any problems, questions or concerns.  A total of more than 60 minutes were spent on this encounter with face-to-face time and non-face-to-face time, including preparing to see the patient, ordering tests and/or medications, counseling the patient and coordination of care as  outlined above.   Ledell Peoples, MD Department of Hematology/Oncology [place] [facility] Phone: [phone] Pager: [phone] Email: [email or web address]  06/01/2021 9:53 AM

## 2021-06-02 ENCOUNTER — Telehealth: Payer: Self-pay | Admitting: Hematology and Oncology

## 2021-06-02 NOTE — Telephone Encounter (Signed)
Per 5/23 los called pt using interpreter about appointment.  Pr confirmed appointment

## 2021-06-08 ENCOUNTER — Other Ambulatory Visit: Payer: Self-pay

## 2021-06-08 ENCOUNTER — Inpatient Hospital Stay: Payer: No Typology Code available for payment source

## 2021-06-08 DIAGNOSIS — D696 Thrombocytopenia, unspecified: Secondary | ICD-10-CM

## 2021-06-08 LAB — CBC WITH DIFFERENTIAL (CANCER CENTER ONLY)
Abs Immature Granulocytes: 0.02 10*3/uL (ref 0.00–0.07)
Basophils Absolute: 0 10*3/uL (ref 0.0–0.1)
Basophils Relative: 0 %
Eosinophils Absolute: 0.1 10*3/uL (ref 0.0–0.5)
Eosinophils Relative: 1 %
HCT: 40 % (ref 36.0–46.0)
Hemoglobin: 13.4 g/dL (ref 12.0–15.0)
Immature Granulocytes: 0 %
Lymphocytes Relative: 28 %
Lymphs Abs: 1.8 10*3/uL (ref 0.7–4.0)
MCH: 27.9 pg (ref 26.0–34.0)
MCHC: 33.5 g/dL (ref 30.0–36.0)
MCV: 83.2 fL (ref 80.0–100.0)
Monocytes Absolute: 0.6 10*3/uL (ref 0.1–1.0)
Monocytes Relative: 9 %
Neutro Abs: 4 10*3/uL (ref 1.7–7.7)
Neutrophils Relative %: 62 %
Platelet Count: 302 10*3/uL (ref 150–400)
RBC: 4.81 MIL/uL (ref 3.87–5.11)
RDW: 12.6 % (ref 11.5–15.5)
WBC Count: 6.5 10*3/uL (ref 4.0–10.5)
nRBC: 0 % (ref 0.0–0.2)

## 2021-06-15 ENCOUNTER — Telehealth: Payer: Self-pay

## 2021-06-15 ENCOUNTER — Inpatient Hospital Stay: Payer: No Typology Code available for payment source | Attending: Hematology and Oncology

## 2021-06-15 ENCOUNTER — Other Ambulatory Visit: Payer: Self-pay

## 2021-06-15 DIAGNOSIS — D696 Thrombocytopenia, unspecified: Secondary | ICD-10-CM | POA: Insufficient documentation

## 2021-06-15 LAB — CBC WITH DIFFERENTIAL (CANCER CENTER ONLY)
Abs Immature Granulocytes: 0.01 10*3/uL (ref 0.00–0.07)
Basophils Absolute: 0 10*3/uL (ref 0.0–0.1)
Basophils Relative: 0 %
Eosinophils Absolute: 0.1 10*3/uL (ref 0.0–0.5)
Eosinophils Relative: 2 %
HCT: 36.7 % (ref 36.0–46.0)
Hemoglobin: 12.1 g/dL (ref 12.0–15.0)
Immature Granulocytes: 0 %
Lymphocytes Relative: 32 %
Lymphs Abs: 1.7 10*3/uL (ref 0.7–4.0)
MCH: 27.7 pg (ref 26.0–34.0)
MCHC: 33 g/dL (ref 30.0–36.0)
MCV: 84 fL (ref 80.0–100.0)
Monocytes Absolute: 0.5 10*3/uL (ref 0.1–1.0)
Monocytes Relative: 9 %
Neutro Abs: 3 10*3/uL (ref 1.7–7.7)
Neutrophils Relative %: 57 %
Platelet Count: 262 10*3/uL (ref 150–400)
RBC: 4.37 MIL/uL (ref 3.87–5.11)
RDW: 13 % (ref 11.5–15.5)
WBC Count: 5.3 10*3/uL (ref 4.0–10.5)
nRBC: 0 % (ref 0.0–0.2)

## 2021-06-15 NOTE — Telephone Encounter (Addendum)
Called patient via [employer] to relay results below. Explained normal range of platelets and reviewed upcoming scheduled appointments. Calender of upcoming appointments mailed to patient's home. ----- Message from Orson Slick, MD sent at 06/15/2021 10:43 AM EDT ----- Please let Angela Ritter (via an interpreter) know that her Plt remain in the normal range, currently at 262. We will continue checking her platelets weekly for the next 2 weeks.   ----- Message ----- From: Buel Ream, [facility] [place] Sent: 06/15/2021  10:40 AM EDT To: Orson Slick, MD

## 2021-06-22 ENCOUNTER — Other Ambulatory Visit: Payer: Self-pay | Admitting: Lab

## 2021-06-22 ENCOUNTER — Inpatient Hospital Stay: Payer: No Typology Code available for payment source

## 2021-06-22 ENCOUNTER — Other Ambulatory Visit: Payer: Self-pay

## 2021-06-22 DIAGNOSIS — D696 Thrombocytopenia, unspecified: Secondary | ICD-10-CM

## 2021-06-22 LAB — CBC WITH DIFFERENTIAL (CANCER CENTER ONLY)
Abs Immature Granulocytes: 0.01 10*3/uL (ref 0.00–0.07)
Basophils Absolute: 0 10*3/uL (ref 0.0–0.1)
Basophils Relative: 1 %
Eosinophils Absolute: 0.1 10*3/uL (ref 0.0–0.5)
Eosinophils Relative: 2 %
HCT: 36.4 % (ref 36.0–46.0)
Hemoglobin: 11.9 g/dL — ABNORMAL LOW (ref 12.0–15.0)
Immature Granulocytes: 0 %
Lymphocytes Relative: 34 %
Lymphs Abs: 1.5 10*3/uL (ref 0.7–4.0)
MCH: 27.7 pg (ref 26.0–34.0)
MCHC: 32.7 g/dL (ref 30.0–36.0)
MCV: 84.7 fL (ref 80.0–100.0)
Monocytes Absolute: 0.4 10*3/uL (ref 0.1–1.0)
Monocytes Relative: 9 %
Neutro Abs: 2.4 10*3/uL (ref 1.7–7.7)
Neutrophils Relative %: 54 %
Platelet Count: 314 10*3/uL (ref 150–400)
RBC: 4.3 MIL/uL (ref 3.87–5.11)
RDW: 13.5 % (ref 11.5–15.5)
WBC Count: 4.4 10*3/uL (ref 4.0–10.5)
nRBC: 0 % (ref 0.0–0.2)

## 2021-06-29 ENCOUNTER — Other Ambulatory Visit: Payer: Self-pay | Admitting: Lab

## 2021-06-29 ENCOUNTER — Other Ambulatory Visit: Payer: Self-pay

## 2021-06-29 ENCOUNTER — Inpatient Hospital Stay: Payer: No Typology Code available for payment source

## 2021-06-29 DIAGNOSIS — D696 Thrombocytopenia, unspecified: Secondary | ICD-10-CM

## 2021-06-29 LAB — CBC WITH DIFFERENTIAL (CANCER CENTER ONLY)
Abs Immature Granulocytes: 0.01 10*3/uL (ref 0.00–0.07)
Basophils Absolute: 0 10*3/uL (ref 0.0–0.1)
Basophils Relative: 0 %
Eosinophils Absolute: 0.1 10*3/uL (ref 0.0–0.5)
Eosinophils Relative: 3 %
HCT: 39 % (ref 36.0–46.0)
Hemoglobin: 12.8 g/dL (ref 12.0–15.0)
Immature Granulocytes: 0 %
Lymphocytes Relative: 36 %
Lymphs Abs: 1.3 10*3/uL (ref 0.7–4.0)
MCH: 27.9 pg (ref 26.0–34.0)
MCHC: 32.8 g/dL (ref 30.0–36.0)
MCV: 85 fL (ref 80.0–100.0)
Monocytes Absolute: 0.4 10*3/uL (ref 0.1–1.0)
Monocytes Relative: 11 %
Neutro Abs: 1.8 10*3/uL (ref 1.7–7.7)
Neutrophils Relative %: 50 %
Platelet Count: 314 10*3/uL (ref 150–400)
RBC: 4.59 MIL/uL (ref 3.87–5.11)
RDW: 13.4 % (ref 11.5–15.5)
WBC Count: 3.7 10*3/uL — ABNORMAL LOW (ref 4.0–10.5)
nRBC: 0 % (ref 0.0–0.2)

## 2021-07-25 NOTE — Progress Notes (Unsigned)
[place] Telephone:(336) [phone]   Fax:(336) [phone]  PROGRESS NOTE  Patient Care Team: Caroline More, DO (Inactive) as PCP - General (Family Medicine)  Hematological/Oncological History # Thrombocytopenia, likely ITP 05/26/2021: presented to the ED with abdominal pain. CBC showed white blood cell count 4.7, hemoglobin 14.5, MCV 84.3, and platelets of 24. Started on PO dexamethasone 40 mg x 4 days. 06/01/2021: establish care with Dr. Lorenso Courier   Interval History:  Angela Ritter 51 y.o. female with medical history significant for ITP who presents for a follow up visit. The patient's last visit was on 06/01/2021. In the interim since the last visit her Plt have normalized after the steroid pulse.  On exam today Angela Ritter reports she has been well overall in the interim since her last visit.  She is not been having any issues with bleeding, bruising, or dark stools.  She does that her weight is steady and her appetite is been quite good.  Her energy levels have been strong.  She exercise about 45 minutes every day.  She notes that in the interim since her last visit she did restart some supplements that she has been taking for menopause.  Overall she feels well and has no questions comments or concerns today.  A full 10 point ROS is listed below.  MEDICAL HISTORY:  Past Medical History:  Diagnosis Date   Anemia    DM2 (diabetes mellitus, type 2) (HCC)    Hypertension     SURGICAL HISTORY: Past Surgical History:  Procedure Laterality Date   CHOLECYSTECTOMY      SOCIAL HISTORY: Social History   Socioeconomic History   Marital status: Married    Spouse name: Not on file   Number of children: Not on file   Years of education: Not on file   Highest education level: Not on file  Occupational History   Not on file  Tobacco Use   Smoking status: Never   Smokeless tobacco: Never  Vaping Use   Vaping Use: Never used  Substance and Sexual Activity    Alcohol use: No   Drug use: No   Sexual activity: Yes    Partners: Male    Birth control/protection: Condom    Comment: 1st intercourse- 5, married- 66 yrs,   Other Topics Concern   Not on file  Social History Narrative   Level of education: middle school    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, house wife     Transportation: friend drives her    Exercise: walks every day for 30 minutes    Housing situation: rent a house    Relationships (safe): yes   Contact for message Health and safety inspector): daughter           Social Determinants of Health   Financial Resource Strain: Not on file  Food Insecurity: Not on file  Transportation Needs: Not on file  Physical Activity: Not on file  Stress: Not on file  Social Connections: Not on file  Intimate Partner Violence: Not on file    FAMILY HISTORY: Family History  Problem Relation Age of Onset   Diabetes Maternal Uncle    Diabetes Paternal Uncle    Cancer Paternal Grandmother        colon   Hypertension Mother    Cancer Sister        Breast Cancer at 76    ALLERGIES:  has No Known Allergies.  MEDICATIONS:  Current Outpatient Medications  Medication Sig Dispense Refill   folic acid (FOLVITE) 1 MG  tablet Take 1 mg by mouth daily. Reported on 04/23/2015     IRON PO Take by mouth 2 (two) times daily. Reported on 04/23/2015 (Patient not taking: Reported on 06/01/2021)     lisinopril-hydrochlorothiazide (PRINZIDE,ZESTORETIC) 20-12.5 MG tablet Take 1 tablet by mouth daily. 30 tablet 1   metFORMIN (GLUCOPHAGE) 850 MG tablet Take 850 mg by mouth 3 (three) times daily.     methocarbamol (ROBAXIN) 500 MG tablet Take 1 tablet (500 mg total) by mouth 2 (two) times daily. 20 tablet 0   Multiple Vitamins-Iron (MULTIVITAMINS WITH IRON) TABS Take 1 tablet by mouth daily. Reported on 04/23/2015 (Patient not taking: Reported on 06/01/2021)     omega-3 acid ethyl esters (LOVAZA) 1 g capsule Take by mouth 2 (two) times daily.     pantoprazole (PROTONIX) 20 MG tablet Take 2  tablets (40 mg total) by mouth daily for 14 days. (Patient not taking: Reported on 06/01/2021) 28 tablet 0   pioglitazone (ACTOS) 15 MG tablet Take by mouth. (Patient not taking: Reported on 06/01/2021)     simvastatin (ZOCOR) 10 MG tablet SMARTSIG:1 Tablet(s) By Mouth Every Evening (Patient not taking: Reported on 06/01/2021)     No current facility-administered medications for this visit.    REVIEW OF SYSTEMS:   Constitutional: ( - ) fevers, ( - )  chills , ( - ) night sweats Eyes: ( - ) blurriness of vision, ( - ) double vision, ( - ) watery eyes Ears, nose, mouth, throat, and face: ( - ) mucositis, ( - ) sore throat Respiratory: ( - ) cough, ( - ) dyspnea, ( - ) wheezes Cardiovascular: ( - ) palpitation, ( - ) chest discomfort, ( - ) lower extremity swelling Gastrointestinal:  ( - ) nausea, ( - ) heartburn, ( - ) change in bowel habits Skin: ( - ) abnormal skin rashes Lymphatics: ( - ) new lymphadenopathy, ( - ) easy bruising Neurological: ( - ) numbness, ( - ) tingling, ( - ) new weaknesses Behavioral/Psych: ( - ) mood change, ( - ) new changes  All other systems were reviewed with the patient and are negative.  PHYSICAL EXAMINATION:  Vitals:   07/26/21 1008  BP: (!) 143/73  Pulse: 76  Resp: 16  Temp: (!) 97.5 F (36.4 C)  SpO2: 98%   Filed Weights   07/26/21 1008  Weight: 154 lb 11.2 oz (70.2 kg)    GENERAL: Well-appearing middle-aged Hispanic female, alert, no distress and comfortable SKIN: skin color, texture, turgor are normal, no rashes or significant lesions EYES: conjunctiva are pink and non-injected, sclera clear LUNGS: clear to auscultation and percussion with normal breathing effort HEART: regular rate & rhythm and no murmurs and no lower extremity edema Musculoskeletal: no cyanosis of digits and no clubbing  PSYCH: alert & oriented x 3, fluent speech NEURO: no focal motor/sensory deficits  LABORATORY DATA:  I have reviewed the data as listed    Latest Ref  Rng & Units 07/26/2021    9:52 AM 06/29/2021   10:29 AM 06/22/2021   10:36 AM  CBC  WBC 4.0 - 10.5 K/uL 9.2  3.7  4.4   Hemoglobin 12.0 - 15.0 g/dL 13.0  12.8  11.9   Hematocrit 36.0 - 46.0 % 38.9  39.0  36.4   Platelets 150 - 400 K/uL 252  314  314        Latest Ref Rng & Units 06/01/2021    8:28 AM 05/26/2021    3:39 PM  07/17/2008    9:20 PM  CMP  Glucose 70 - 99 mg/dL 320  255  137   BUN 6 - 20 mg/dL 13  12  8    Creatinine 0.44 - 1.00 mg/dL 0.67  0.43  0.59   Sodium 135 - 145 mmol/L 132  133  132   Potassium 3.5 - 5.1 mmol/L 3.8  4.2  3.0   Chloride 98 - 111 mmol/L 93  99  99   CO2 22 - 32 mmol/L 32  26  24   Calcium 8.9 - 10.3 mg/dL 9.2  9.6  8.9   Total Protein 6.5 - 8.1 g/dL 7.6  8.3  7.9   Total Bilirubin 0.3 - 1.2 mg/dL 0.9  0.8  1.1   Alkaline Phos 38 - 126 U/L 52  55  54   AST 15 - 41 U/L 10  20  23    ALT 0 - 44 U/L 13  24  25      RADIOGRAPHIC STUDIES: No results found.  ASSESSMENT & PLAN Angela Ritter 52 y.o. female with medical history significant for ITP who presents for a follow up visit.   #Isolated Severe Thrombocytopenia # Immune Thrombocytopenic Purpura (ITP) -- Patient had normal hemoglobin and no other abnormalities noted in the blood.  With isolated thrombocytopenia the most likely etiology is ITP --With normal hemoglobin markedly low suspicion for TMA.  No need to pursue that work-up at this time --patient completed 40 mg dexamethasone p.o. daily x4 days with robust rebound.  -- today labs show white blood cell 9.2, hemoglobin 30.0, MCV 82.2, and platelets of 252 --No indication for a bone marrow biopsy.  --Recommend labs in 3 months time and a clinic visit in 6 months time.    No orders of the defined types were placed in this encounter.   All questions were answered. The patient knows to call the clinic with any problems, questions or concerns.  A total of more than 25 minutes were spent on this encounter with face-to-face time and  non-face-to-face time, including preparing to see the patient, ordering tests and/or medications, counseling the patient and coordination of care as outlined above.   Ledell Peoples, MD Department of Hematology/Oncology [address] [facility] Phone: [phone] Pager: [phone] Email: [email or web address]  07/26/2021 12:43 PM

## 2021-07-26 ENCOUNTER — Inpatient Hospital Stay (HOSPITAL_BASED_OUTPATIENT_CLINIC_OR_DEPARTMENT_OTHER): Payer: No Typology Code available for payment source | Admitting: Hematology and Oncology

## 2021-07-26 ENCOUNTER — Inpatient Hospital Stay: Payer: No Typology Code available for payment source | Attending: Hematology and Oncology

## 2021-07-26 ENCOUNTER — Other Ambulatory Visit: Payer: Self-pay

## 2021-07-26 VITALS — BP 143/73 | HR 76 | Temp 97.5°F | Resp 16 | Wt 154.7 lb

## 2021-07-26 DIAGNOSIS — D696 Thrombocytopenia, unspecified: Secondary | ICD-10-CM

## 2021-07-26 DIAGNOSIS — Z79899 Other long term (current) drug therapy: Secondary | ICD-10-CM | POA: Insufficient documentation

## 2021-07-26 DIAGNOSIS — D693 Immune thrombocytopenic purpura: Secondary | ICD-10-CM | POA: Insufficient documentation

## 2021-07-26 LAB — CBC WITH DIFFERENTIAL (CANCER CENTER ONLY)
Abs Immature Granulocytes: 0.02 10*3/uL (ref 0.00–0.07)
Basophils Absolute: 0 10*3/uL (ref 0.0–0.1)
Basophils Relative: 0 %
Eosinophils Absolute: 0.1 10*3/uL (ref 0.0–0.5)
Eosinophils Relative: 1 %
HCT: 38.9 % (ref 36.0–46.0)
Hemoglobin: 13 g/dL (ref 12.0–15.0)
Immature Granulocytes: 0 %
Lymphocytes Relative: 20 %
Lymphs Abs: 1.9 10*3/uL (ref 0.7–4.0)
MCH: 27.5 pg (ref 26.0–34.0)
MCHC: 33.4 g/dL (ref 30.0–36.0)
MCV: 82.2 fL (ref 80.0–100.0)
Monocytes Absolute: 0.5 10*3/uL (ref 0.1–1.0)
Monocytes Relative: 5 %
Neutro Abs: 6.7 10*3/uL (ref 1.7–7.7)
Neutrophils Relative %: 74 %
Platelet Count: 252 10*3/uL (ref 150–400)
RBC: 4.73 MIL/uL (ref 3.87–5.11)
RDW: 12.3 % (ref 11.5–15.5)
WBC Count: 9.2 10*3/uL (ref 4.0–10.5)
nRBC: 0 % (ref 0.0–0.2)

## 2021-10-26 ENCOUNTER — Inpatient Hospital Stay: Payer: Self-pay | Attending: Hematology and Oncology

## 2022-01-26 ENCOUNTER — Other Ambulatory Visit: Payer: Self-pay | Admitting: Hematology and Oncology

## 2022-01-26 ENCOUNTER — Telehealth: Payer: Self-pay

## 2022-01-26 ENCOUNTER — Inpatient Hospital Stay: Payer: No Typology Code available for payment source | Admitting: Hematology and Oncology

## 2022-01-26 ENCOUNTER — Inpatient Hospital Stay: Payer: No Typology Code available for payment source | Attending: Hematology and Oncology

## 2022-01-26 DIAGNOSIS — D696 Thrombocytopenia, unspecified: Secondary | ICD-10-CM

## 2022-01-26 NOTE — Telephone Encounter (Signed)
Spoke with patient and patient's daughter regarding missed appointments today. Patient states that she does not want to return to [facility] for monitoring and treatment. Dr. Lorenso Courier aware of phone conversation.

## 2022-02-04 ENCOUNTER — Encounter: Payer: Self-pay | Admitting: Obstetrics & Gynecology

## 2022-02-04 ENCOUNTER — Ambulatory Visit (INDEPENDENT_AMBULATORY_CARE_PROVIDER_SITE_OTHER): Payer: Self-pay | Admitting: Obstetrics & Gynecology

## 2022-02-04 VITALS — BP 114/72 | HR 71 | Ht 63.75 in | Wt 150.0 lb

## 2022-02-04 DIAGNOSIS — Z789 Other specified health status: Secondary | ICD-10-CM

## 2022-02-04 DIAGNOSIS — N951 Menopausal and female climacteric states: Secondary | ICD-10-CM

## 2022-02-04 DIAGNOSIS — L659 Nonscarring hair loss, unspecified: Secondary | ICD-10-CM

## 2022-02-04 DIAGNOSIS — Z01419 Encounter for gynecological examination (general) (routine) without abnormal findings: Secondary | ICD-10-CM

## 2022-02-04 NOTE — Progress Notes (Signed)
Angela Ritter 04-09-1970 [id number]   History:    52 y.o. G3P3L3  Married   RP:  Established patient presenting for annual gyn exam    HPI:  Perimenopausal symptoms with most periods every month, but occasional oligomenorrhea with spacing 2-3 months.  Frequent night sweats with insomnia.  C/O hair loss recently. No pelvic pain. Low libido. No pain with IC. Using condoms. Last pap neg in 01/2021. No h/o abnormal Pap.  Will repeat at 3 years. Breasts normal.  Last mammo neg in 2016. Strongly recommended to schedule now.  Will schedule [facility].  BMI 25.95. Walking. Health labs with Fam MD. Flu vaccine declined.   Past medical history,surgical history, family history and social history were all reviewed and documented in the EPIC chart.  Gynecologic History Patient's last menstrual period was 01/07/2022 (exact date).  Obstetric History OB History  Gravida Para Term Preterm AB Living  '3 3 3     3  '$ SAB IAB Ectopic Multiple Live Births               # Outcome Date GA Lbr Len/2nd Weight Sex Delivery Anes PTL Lv  3 Term           2 Term           1 Term              ROS: A ROS was performed and pertinent positives and negatives are included in the history. GENERAL: No fevers or chills. HEENT: No change in vision, no earache, sore throat or sinus congestion. NECK: No pain or stiffness. CARDIOVASCULAR: No chest pain or pressure. No palpitations. PULMONARY: No shortness of breath, cough or wheeze. GASTROINTESTINAL: No abdominal pain, nausea, vomiting or diarrhea, melena or bright red blood per rectum. GENITOURINARY: No urinary frequency, urgency, hesitancy or dysuria. MUSCULOSKELETAL: No joint or muscle pain, no back pain, no recent trauma. DERMATOLOGIC: No rash, no itching, no lesions. ENDOCRINE: No polyuria, polydipsia, no heat or cold intolerance. No recent change in weight. HEMATOLOGICAL: No anemia or easy bruising or bleeding. NEUROLOGIC: No headache, seizures, numbness, tingling or  weakness. PSYCHIATRIC: No depression, no loss of interest in normal activity or change in sleep pattern.     Exam:   BP 114/72   Pulse 71   Ht 5' 3.75" (1.619 m)   Wt 150 lb (68 kg)   LMP 01/07/2022 (Exact Date) Comment: sexually active-condoms  SpO2 99%   BMI 25.95 kg/m   Body mass index is 25.95 kg/m.  General appearance : Well developed well nourished female. No acute distress HEENT: Eyes: no retinal hemorrhage or exudates,  Neck supple, trachea midline, no carotid bruits, no thyroidmegaly Lungs: Clear to auscultation, no rhonchi or wheezes, or rib retractions  Heart: Regular rate and rhythm, no murmurs or gallops Breast:Examined in sitting and supine position were symmetrical in appearance, no palpable masses or tenderness,  no skin retraction, no nipple inversion, no nipple discharge, no skin discoloration, no axillary or supraclavicular lymphadenopathy Abdomen: no palpable masses or tenderness, no rebound or guarding Extremities: no edema or skin discoloration or tenderness  Pelvic: Vulva: Normal             Vagina: No gross lesions or discharge  Cervix: No gross lesions or discharge  Uterus  AV, normal size, shape and consistency, non-tender and mobile  Adnexa  Without masses or tenderness  Anus: Normal   Assessment/Plan:  52 y.o. female for annual exam   1. Well female exam with routine  gynecological exam Perimenopausal symptoms with most periods every month, but occasional oligomenorrhea with spacing 2-3 months.  Frequent night sweats with insomnia.  C/O hair loss recently. No pelvic pain. Low libido. No pain with IC. Using condoms. Last pap neg in 01/2021. No h/o abnormal Pap.  Will repeat at 3 years. Breasts normal.  Last mammo neg in 2016. Strongly recommended to schedule now.  Will schedule Colono.  BMI 25.95. Walking. Health labs with Fam MD. Flu vaccine declined.  2. Perimenopause Perimenopausal symptoms with most periods every month, but occasional  oligomenorrhea with spacing 2-3 months.  Frequent night sweats with insomnia.  C/O hair loss recently. No pelvic pain. Low libido. No pain with IC. Using condoms. May try Ashagandha.  3. Use of condoms for contraception  4. Hair loss R/O Hypothyroidism. - TSH  Other orders - atorvastatin (LIPITOR) 40 MG tablet; Take 40 mg by mouth daily. - omeprazole (PRILOSEC) 20 MG capsule; SMARTSIG:2 Capsule(s) By Mouth Daily PRN   [name] MD, 11:34 AM

## 2022-02-05 LAB — TSH: TSH: 1.07 mIU/L

## 2022-04-11 ENCOUNTER — Encounter: Payer: Self-pay | Admitting: Nurse Practitioner

## 2022-04-11 ENCOUNTER — Ambulatory Visit (INDEPENDENT_AMBULATORY_CARE_PROVIDER_SITE_OTHER): Payer: Self-pay | Admitting: Nurse Practitioner

## 2022-04-11 VITALS — BP 126/70 | HR 87 | Temp 101.6°F | Wt 146.0 lb

## 2022-04-11 DIAGNOSIS — R300 Dysuria: Secondary | ICD-10-CM

## 2022-04-11 DIAGNOSIS — N3001 Acute cystitis with hematuria: Secondary | ICD-10-CM

## 2022-04-11 MED ORDER — NITROFURANTOIN MONOHYD MACRO 100 MG PO CAPS
100.0000 mg | ORAL_CAPSULE | Freq: Two times a day (BID) | ORAL | 0 refills | Status: AC
Start: 2022-04-11 — End: ?

## 2022-04-11 NOTE — Progress Notes (Signed)
   Acute Office Visit  Subjective:    Patient ID: Angela Ritter, female    DOB: 1970-05-19, 52 y.o.   MRN: [id number]   HPI 52 y.o. presents today for dysuria x 6 days and hematuria and fever x 3 days. Took antibiotic "cillin" she had at home but symptoms did not improve. She reports having UTI in February as well. Wonders if it is related to intercourse. Audio interpreter used during entirety of visit.    Review of Systems  Constitutional:  Positive for fever. Negative for chills.  Genitourinary:  Positive for dysuria, flank pain, frequency, hematuria and urgency. Negative for difficulty urinating.       Objective:    Physical Exam Constitutional:      Appearance: Normal appearance.  Abdominal:     Tenderness: There is no right CVA tenderness or left CVA tenderness.     BP 126/70   Pulse 87   Temp (!) 101.6 F (38.7 C)   Wt 146 lb (66.2 kg)   SpO2 99%   BMI 25.26 kg/m  Wt Readings from Last 3 Encounters:  04/11/22 146 lb (66.2 kg)  02/04/22 150 lb (68 kg)  07/26/21 154 lb 11.2 oz (70.2 kg)        UA: Leukocytes 1+, nitrite positive, 2+ protein, 2+ blood, 4+ ketones, 2+ glucose, dark yellow/cloudy. Microscopic: wbc 20-40, rbc 3-10, moderate bacteria  Assessment & Plan:   Problem List Items Addressed This Visit   None Visit Diagnoses     Acute cystitis with hematuria    -  Primary   Relevant Medications   nitrofurantoin, macrocrystal-monohydrate, (MACROBID) 100 MG capsule   Dysuria       Relevant Orders   Urinalysis,Complete w/RFL Culture (Completed)      Plan: Acute cystitis - Nitrofurantoin 100 mg BID x 7 days. Culture pending. Increase water intake, AZO OTC. Fever present, normal HR/BP. Some slight flank pain, no CVA tenderness on exam. If symptoms do not improve in 24-36 hours, temperatures remain high or she experiences worsening back pain or difficulty urinating ER recommended due to risk of pyelonephritis. All questions answered.       Tamela Gammon DNP, 12:33 PM 04/11/2022

## 2022-04-13 LAB — URINALYSIS, COMPLETE W/RFL CULTURE
Bilirubin Urine: NEGATIVE
Casts: NONE SEEN /LPF
Crystals: NONE SEEN /HPF
Hyaline Cast: NONE SEEN /LPF
Nitrites, Initial: POSITIVE — AB
Specific Gravity, Urine: 1.015 (ref 1.001–1.035)
Yeast: NONE SEEN /HPF
pH: 5.5 (ref 5.0–8.0)

## 2022-04-13 LAB — URINE CULTURE
MICRO NUMBER:: 14765871
SPECIMEN QUALITY:: ADEQUATE

## 2022-04-13 LAB — CULTURE INDICATED

## 2022-04-15 ENCOUNTER — Telehealth: Payer: Self-pay

## 2022-04-15 NOTE — Telephone Encounter (Signed)
Pt's daughter per DPR notified of recommendations and voiced understanding. Will route to provider for final review and will close encounter.

## 2022-04-15 NOTE — Telephone Encounter (Signed)
FYI. When notifying pt's daughter per DPR re: pt's UA/Ucx results/recommendations. She reported that after the pt started taking the medication, she c/o of possible side effects of numbness in rt hand that eventually went into her B/L legs as well (mainly now in lower part below knees). Pt's daughter reported that pt stayed in bed for most of the 1st day of taking medications due to not feeling well but would get up to use restroom and eat.  Pt reports pt has been mainly spending most of days now on couch versus bed for rest and elevating legs. States they thought about a trip to ER yesterday but decided not to. Also reports still experiencing some intermittent "kidney" pains.   Please advise.

## 2022-04-15 NOTE — Telephone Encounter (Signed)
Be seen at urgent care or ED

## 2022-05-03 ENCOUNTER — Encounter: Payer: Self-pay | Admitting: Family Medicine

## 2022-05-03 DIAGNOSIS — Z1231 Encounter for screening mammogram for malignant neoplasm of breast: Secondary | ICD-10-CM

## 2022-05-09 ENCOUNTER — Telehealth: Payer: Self-pay

## 2022-05-09 NOTE — Telephone Encounter (Signed)
Telephoned patient at mobile number using interpreter#[id number]. [facility] unable to leave a message. Voice mail option unavailable.

## 2022-05-10 ENCOUNTER — Other Ambulatory Visit: Payer: Self-pay | Admitting: Nurse Practitioner

## 2022-05-10 DIAGNOSIS — Z1231 Encounter for screening mammogram for malignant neoplasm of breast: Secondary | ICD-10-CM

## 2022-06-08 ENCOUNTER — Ambulatory Visit
Admission: RE | Admit: 2022-06-08 | Discharge: 2022-06-08 | Disposition: A | Discharge status: Home / Self Care | Payer: No Typology Code available for payment source | Source: Ambulatory Visit | Attending: Home Health Services | Admitting: Home Health Services

## 2022-06-08 DIAGNOSIS — Z1231 Encounter for screening mammogram for malignant neoplasm of breast: Secondary | ICD-10-CM

## 2022-10-27 ENCOUNTER — Emergency Department (HOSPITAL_BASED_OUTPATIENT_CLINIC_OR_DEPARTMENT_OTHER)
Admission: EM | Admit: 2022-10-27 | Discharge: 2022-10-27 | Disposition: A | Discharge status: Home / Self Care | Payer: Self-pay | Attending: Emergency Medicine | Admitting: Emergency Medicine

## 2022-10-27 ENCOUNTER — Encounter (HOSPITAL_BASED_OUTPATIENT_CLINIC_OR_DEPARTMENT_OTHER): Payer: Self-pay | Admitting: Emergency Medicine

## 2022-10-27 ENCOUNTER — Other Ambulatory Visit: Payer: Self-pay

## 2022-10-27 DIAGNOSIS — G4700 Insomnia, unspecified: Secondary | ICD-10-CM | POA: Insufficient documentation

## 2022-10-27 DIAGNOSIS — Z7984 Long term (current) use of oral hypoglycemic drugs: Secondary | ICD-10-CM | POA: Insufficient documentation

## 2022-10-27 DIAGNOSIS — E119 Type 2 diabetes mellitus without complications: Secondary | ICD-10-CM | POA: Insufficient documentation

## 2022-10-27 DIAGNOSIS — Z79899 Other long term (current) drug therapy: Secondary | ICD-10-CM | POA: Insufficient documentation

## 2022-10-27 DIAGNOSIS — I10 Essential (primary) hypertension: Secondary | ICD-10-CM | POA: Insufficient documentation

## 2022-10-27 NOTE — ED Triage Notes (Signed)
Pt via pov from home with insomnia; states she has not slept in 2 weeks. Has not seen pcp; has taken "vitamins" to help her sleep with no help. Pt has a PCP  but has not seen him. Pt wants to be sedated so she can sleep. Pt alert & oriented, nad noted.

## 2022-10-27 NOTE — ED Provider Notes (Signed)
[place] EMERGENCY DEPARTMENT AT [facility] Provider Note   CSN: [id number] Arrival date & time: 10/27/22  2952     History Chief Complaint  Patient presents with   Insomnia     Angela Ritter is a 51 y.o. female.  Patient presents to the emergency department concerns of insomnia.  Past history significant for type 2 diabetes, hypertension, hyperlipidemia.  Reports she does have about 2 weeks with poor sleep.  She is advising me that she is not able to have any sleep during this period of time however I doubt this is patient denies any significant behavioral changes or episodes of mania.  Patient not currently having any auditory visual hallucinations.  Reports she is try taking vitamins as well as other medications to help with sleep such as melatonin and Benadryl without notable improvement.  Suspect that patient is likely sleeping in short increments at night given that she is not acutely psychotic at this moment.  Patient does have a primary care provider but has not been able to see them to discuss this concern.  Denies any stimulating substances or heavy caffeine use.  Also endorses that she has been trying different vitamins and supplements to help with sleep with no success.   Insomnia       Home Medications Prior to Admission medications   Medication Sig Start Date End Date Taking? Authorizing Provider  glipiZIDE (GLUCOTROL) 10 MG tablet Take 10 mg by mouth 2 (two) times daily. 08/05/22  Yes [provider]  nystatin cream (MYCOSTATIN) Apply 1 Application topically 2 (two) times daily. 08/05/22  Yes [provider]  progesterone (PROMETRIUM) 100 MG capsule Take 100-200 mg by mouth at bedtime. 06/20/22  Yes [provider]  atorvastatin (LIPITOR) 40 MG tablet Take 40 mg by mouth daily. 01/21/22   [provider]  folic acid (FOLVITE) 1 MG tablet Take 1 mg by mouth daily. Reported on 04/23/2015    [provider]  IRON  PO Take by mouth 2 (two) times daily. Reported on 04/23/2015    [provider]  lisinopril-hydrochlorothiazide (PRINZIDE,ZESTORETIC) 20-12.5 MG tablet Take 1 tablet by mouth daily. 02/25/16   Arvilla Market, MD  metFORMIN (GLUCOPHAGE) 850 MG tablet Take 850 mg by mouth 3 (three) times daily. 12/17/20   [provider]  Multiple Vitamins-Iron (MULTIVITAMINS WITH IRON) TABS Take 1 tablet by mouth daily. Reported on 04/23/2015    [provider]  nitrofurantoin, macrocrystal-monohydrate, (MACROBID) 100 MG capsule Take 1 capsule (100 mg total) by mouth 2 (two) times daily. 04/11/22   Olivia Mackie, NP  omega-3 acid ethyl esters (LOVAZA) 1 g capsule Take by mouth 2 (two) times daily.    [provider]  omeprazole (PRILOSEC) 20 MG capsule SMARTSIG:2 Capsule(s) By Mouth Daily PRN 01/20/22   [provider]  pioglitazone (ACTOS) 15 MG tablet Take by mouth. 01/13/21   [provider]      Allergies    Patient has no known allergies.    Review of Systems   Review of Systems  Psychiatric/Behavioral:  Positive for sleep disturbance. The patient has insomnia.   All other systems reviewed and are negative.   Physical Exam Updated Vital Signs BP (!) 148/84   Pulse 73   Temp 97.9 F (36.6 C) (Oral)   Resp 16   Ht 5' 3.75" (1.619 m)   Wt 66.2 kg   LMP  (Approximate)   SpO2 94%   BMI 25.25 kg/m  Physical Exam  Vitals and nursing note reviewed.  Constitutional:      General: She is not in acute distress.    Appearance: She is well-developed.  HENT:     Head: Normocephalic and atraumatic.  Eyes:     Conjunctiva/sclera: Conjunctivae normal.  Cardiovascular:     Rate and Rhythm: Normal rate and regular rhythm.     Heart sounds: No murmur heard. Pulmonary:     Effort: Pulmonary effort is normal. No respiratory distress.     Breath sounds: Normal breath sounds.  Abdominal:     Palpations: Abdomen is soft.     Tenderness: There is  no abdominal tenderness.  Musculoskeletal:        General: No swelling.     Cervical back: Neck supple.  Skin:    General: Skin is warm and dry.     Capillary Refill: Capillary refill takes less than 2 seconds.  Neurological:     Mental Status: She is alert.  Psychiatric:        Attention and Perception: Attention and perception normal.        Mood and Affect: Mood and affect normal.        Behavior: Behavior normal.        Thought Content: Thought content normal.        Judgment: Judgment normal.     ED Results / Procedures / Treatments   Labs (all labs ordered are listed, but only abnormal results are displayed) Labs Reviewed - No data to display  EKG None  Radiology No results found.  Procedures Procedures   Medications Ordered in ED Medications - No data to display  ED Course/ Medical Decision Making/ A&P                               Medical Decision Making  This patient presents to the ED for concern of insomnia.  Differential diagnosis includes poor sleep hygiene, insomnia, manic episode, psychosis, substance use   Problem List / ED Course:  Patient presents the emergency department concerns of insomnia.  Past medical history significant for hypertension, hyperlipidemia, type 2 diabetes.  Reports he is having poor sleep for the last 2 weeks and she states that she does not have any sleep other than those 2 weeks.  Currently denies any auditory visual hallucinations and family denies any noticeable behavioral changes.  The patient is conversing at what appears to be baseline without any signs of responding to any internal stimuli.  Highly doubt that patient has been without sleep for 2 weeks as she likely would be experiencing psychosis at this point.  She has tried over-the-counter medication such as melatonin and Benadryl for sleep without improvement. After discussion with patient regarding her symptoms and management, in agreement that this should be managed by  primary care provider for possible prescription grade medications for sleep.  Also provided family with resources for sleep specialist the patient continues to have prolonged course of difficulty with sleep.  Discussed sleep hygiene as well as other factors that may be impacting sleep and things to work on at home.  Discussed return precautions with patient.  Patient discharged home in stable condition.  Final Clinical Impression(s) / ED Diagnoses Final diagnoses:  Insomnia, unspecified type    Rx / DC Orders ED Discharge Orders     None         Smitty Knudsen, PA-C 10/27/22 1823    Vanetta Mulders,  MD 10/28/22 [phone]

## 2022-10-27 NOTE — Discharge Instructions (Signed)
Le atendieron hoy en urgencias por problemas de insomnio. Comunquese con su proveedor de atencin primaria sobre posibles medicamentos para ayudar con el insomnio, ya que existen opciones dado que no ha tenido xito con las opciones de Sales promotion account executive. Tambin le proporcion informacin sobre un grupo de centros del sueo a Games developer de [facility] con el que trabajamos.  You were seen in the ER today for concerns of  insomnia. Please reach out to your primary care provider regarding potential medications to help with insomnia as there are options given you have not had success with over the counter options. I have also provided you with information for a sleep center group through [facility] that we work with.

## 2022-10-31 NOTE — Plan of Care (Signed)
[facility] Tonsillectomy/Adenoidectomy, Postoperative PEDS care plan entered in error.

## 2023-03-04 IMAGING — US US ABDOMEN LIMITED
1 series · 14 of 25 positions shown · non-contrast
Comparison: None Available.

CLINICAL DATA: Right upper quadrant pain

EXAM:
ULTRASOUND ABDOMEN LIMITED RIGHT UPPER QUADRANT

[Series 1: us abdomen limited ruq (liver/gb) · 14 of 28 slices shown]
[im 1/28]
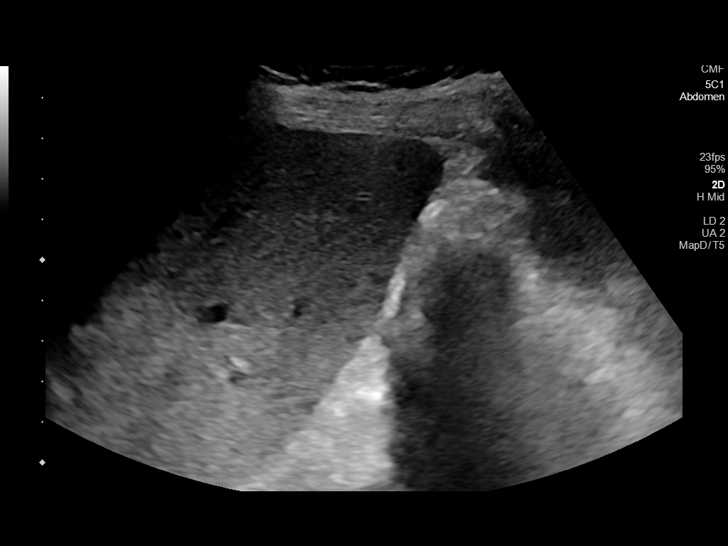
[im 3/28]
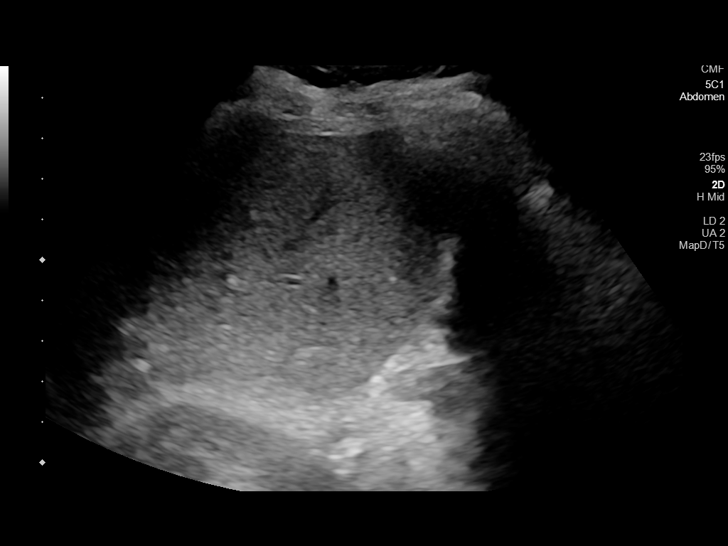
[im 5/28]
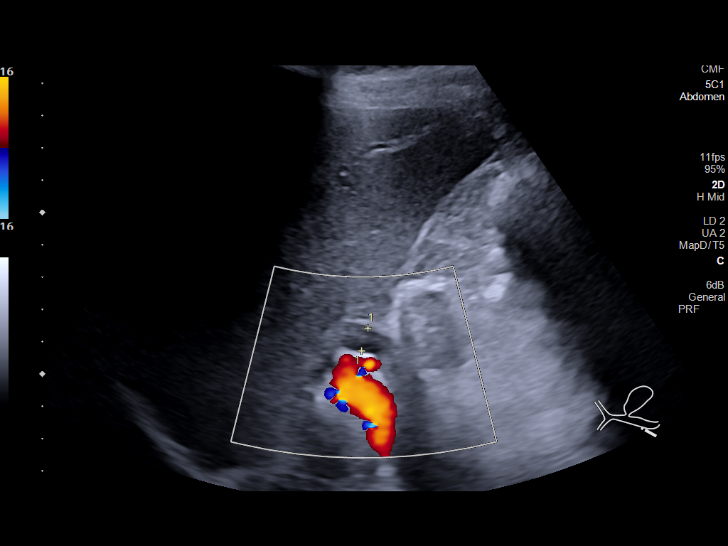
[im 7/28]
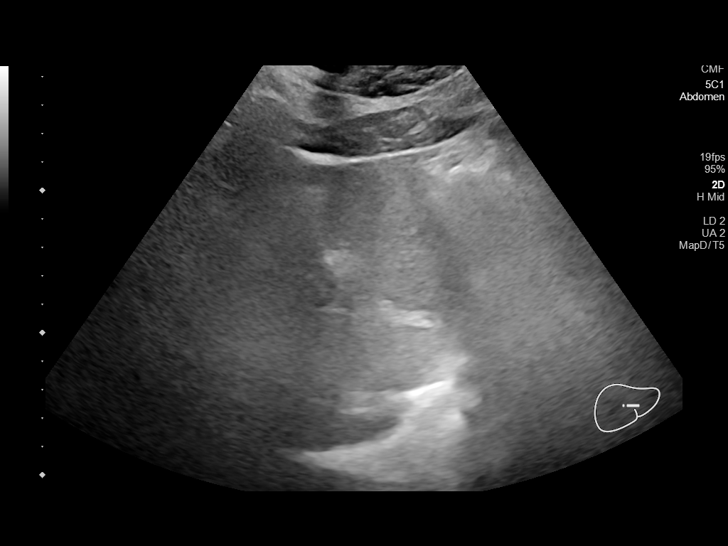
[im 10/28]
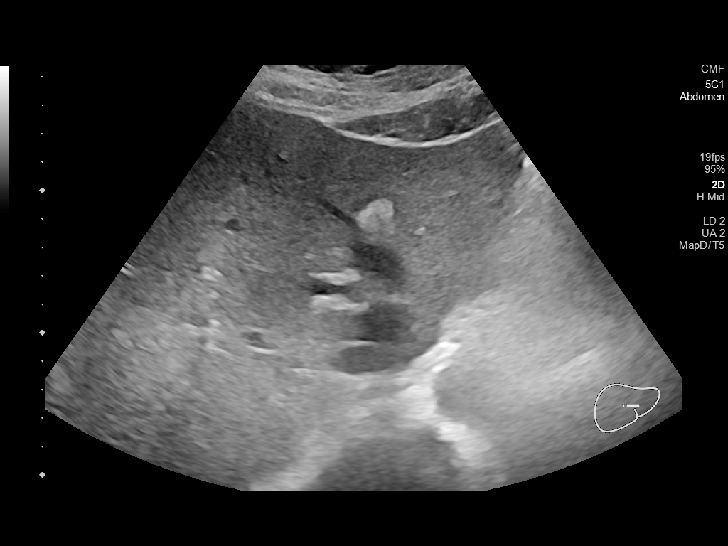
[im 11/28]
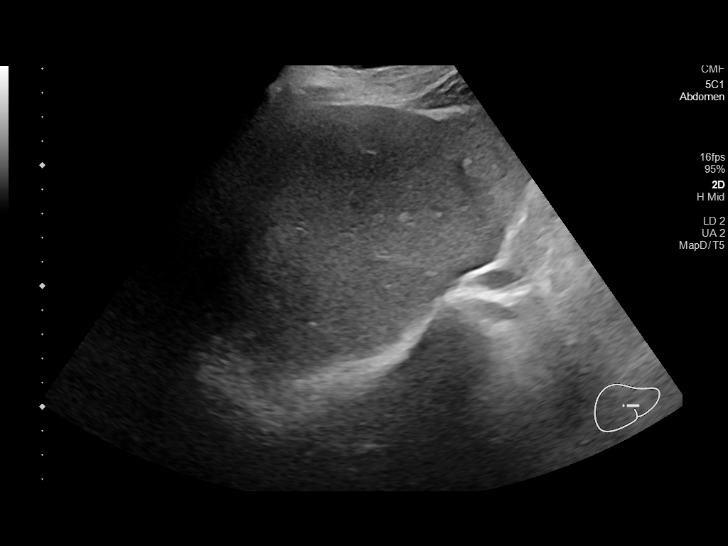
[im 13/28]
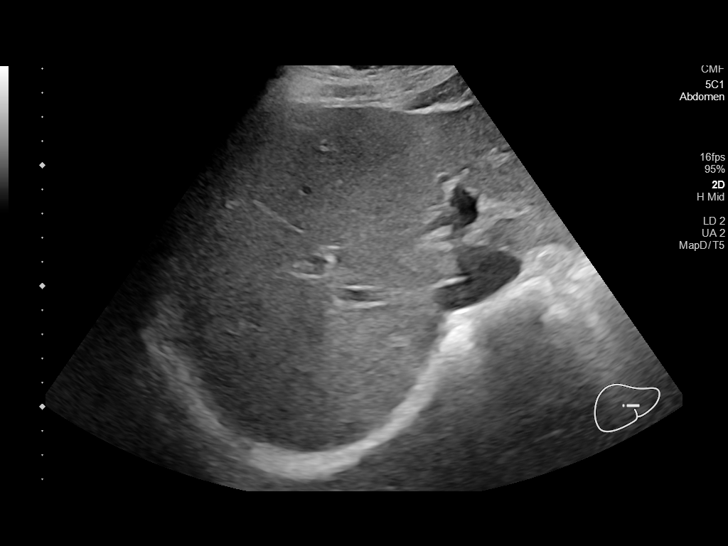
[im 15/28]
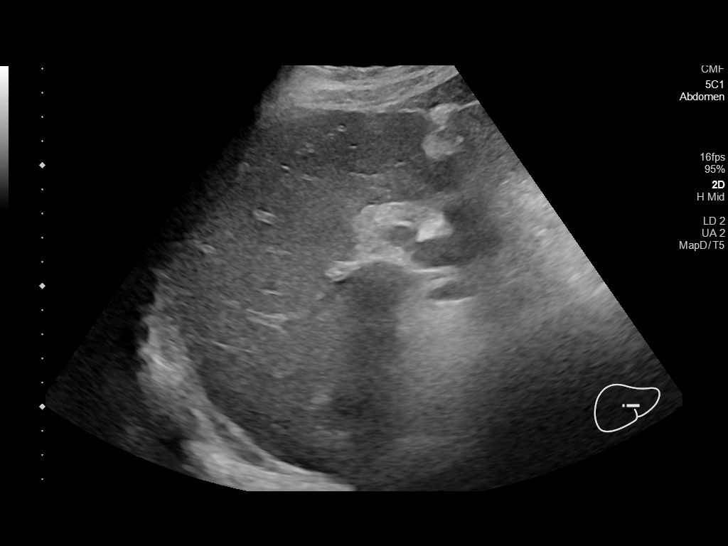
[im 17/28]
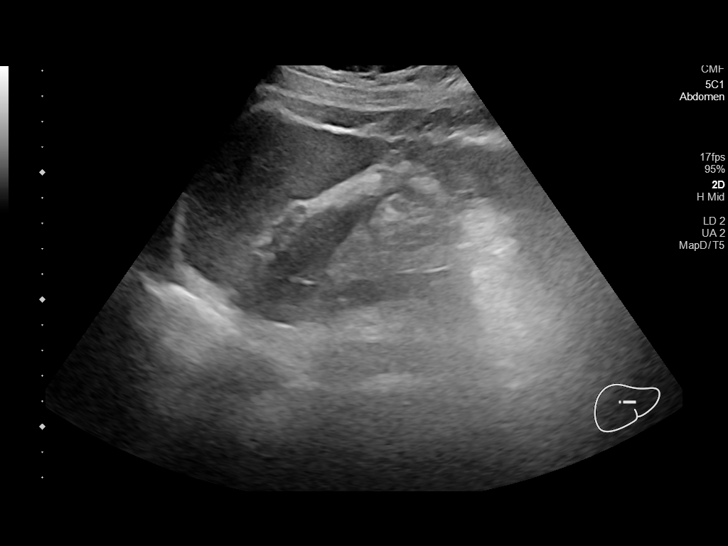
[im 19/28]
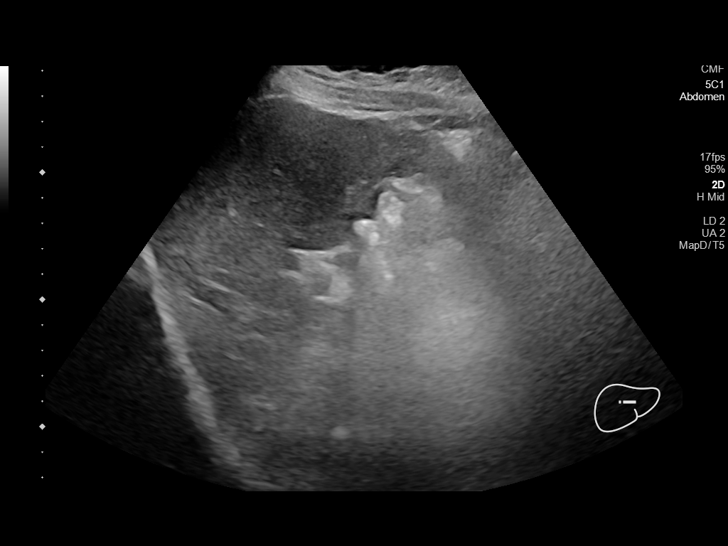
[im 21/28]
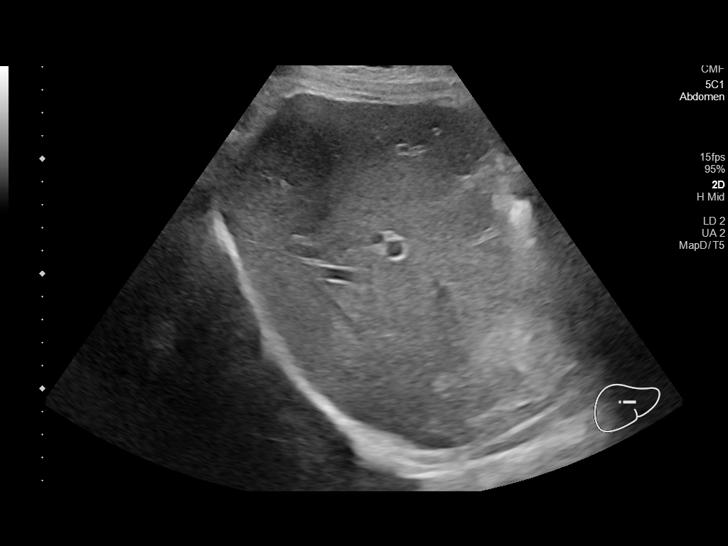
[im 23/28]
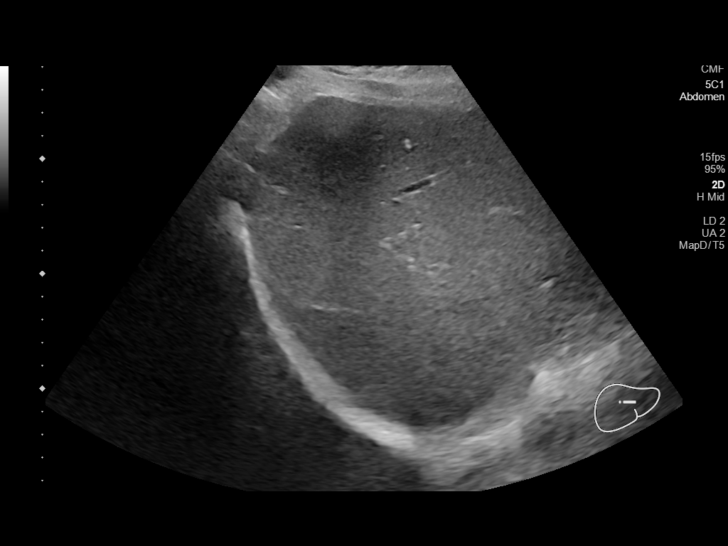
[im 25/28]
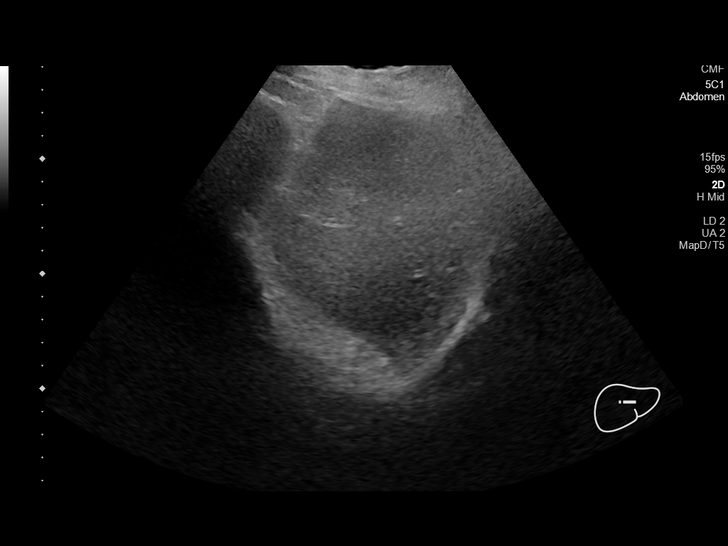
[im 28/28]
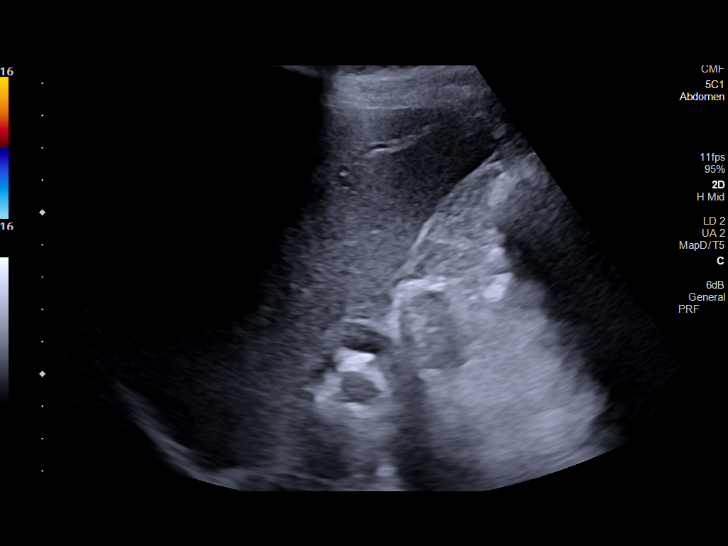

[14 of 25 positions shown; findings below may reference images not displayed]

FINDINGS: Gallbladder:

Prior cholecystectomy

Common bile duct:

Diameter: 7 mm in diameter, slightly prominent likely related to
post cholecystectomy state.

Liver:

Heterogeneous, mildly increased echotexture suggesting fatty
infiltration. No focal hepatic abnormality. Portal vein is patent on
color Doppler imaging with normal direction of blood flow towards
the liver.

Other: None.
IMPRESSION: Prior cholecystectomy.

Suspect early fatty infiltration of the liver.
